# Patient Record
Sex: Female | Born: 1970 | Race: White | Hispanic: No | Marital: Married | Smoking: Current every day smoker
Health system: Southern US, Community
[De-identification: ages and names within clinical notes are randomized; demographics above are authoritative.]

## PROBLEM LIST (undated history)

## (undated) DIAGNOSIS — F319 Bipolar disorder, unspecified: Secondary | ICD-10-CM

## (undated) DIAGNOSIS — M48061 Spinal stenosis, lumbar region without neurogenic claudication: Secondary | ICD-10-CM

## (undated) DIAGNOSIS — H579 Unspecified disorder of eye and adnexa: Secondary | ICD-10-CM

## (undated) DIAGNOSIS — I82409 Acute embolism and thrombosis of unspecified deep veins of unspecified lower extremity: Secondary | ICD-10-CM

## (undated) DIAGNOSIS — G43909 Migraine, unspecified, not intractable, without status migrainosus: Secondary | ICD-10-CM

## (undated) DIAGNOSIS — G039 Meningitis, unspecified: Secondary | ICD-10-CM

## (undated) HISTORY — DX: Bipolar disorder, unspecified: F31.9

## (undated) HISTORY — DX: Unspecified disorder of eye and adnexa: H57.9

## (undated) HISTORY — DX: Spinal stenosis, lumbar region without neurogenic claudication: M48.061

## (undated) HISTORY — DX: Acute embolism and thrombosis of unspecified deep veins of unspecified lower extremity: I82.409

## (undated) HISTORY — DX: Migraine, unspecified, not intractable, without status migrainosus: G43.909

## (undated) HISTORY — DX: Meningitis, unspecified: G03.9

---

## 2000-10-08 DIAGNOSIS — I82409 Acute embolism and thrombosis of unspecified deep veins of unspecified lower extremity: Secondary | ICD-10-CM

## 2000-10-08 HISTORY — DX: Acute embolism and thrombosis of unspecified deep veins of unspecified lower extremity: I82.409

## 2004-06-09 ENCOUNTER — Encounter (INDEPENDENT_AMBULATORY_CARE_PROVIDER_SITE_OTHER): Payer: Self-pay | Admitting: Specialist

## 2004-06-09 ENCOUNTER — Ambulatory Visit (HOSPITAL_COMMUNITY): Admission: RE | Admit: 2004-06-09 | Discharge: 2004-06-09 | Payer: Self-pay | Admitting: Gynecology

## 2004-12-23 ENCOUNTER — Emergency Department: Payer: Self-pay | Admitting: Emergency Medicine

## 2006-07-11 ENCOUNTER — Ambulatory Visit: Payer: Self-pay | Admitting: Internal Medicine

## 2006-10-08 HISTORY — PX: OTHER SURGICAL HISTORY: SHX169

## 2006-10-14 ENCOUNTER — Encounter (INDEPENDENT_AMBULATORY_CARE_PROVIDER_SITE_OTHER): Payer: Self-pay | Admitting: Gynecology

## 2006-10-14 ENCOUNTER — Ambulatory Visit: Payer: Self-pay | Admitting: Gynecology

## 2006-10-23 ENCOUNTER — Ambulatory Visit: Payer: Self-pay | Admitting: Obstetrics & Gynecology

## 2007-02-27 ENCOUNTER — Ambulatory Visit: Payer: Self-pay | Admitting: Gynecology

## 2007-07-21 ENCOUNTER — Ambulatory Visit: Payer: Self-pay | Admitting: Gynecology

## 2007-10-07 ENCOUNTER — Ambulatory Visit (HOSPITAL_COMMUNITY): Admission: RE | Admit: 2007-10-07 | Discharge: 2007-10-08 | Payer: Self-pay | Admitting: Gynecology

## 2007-10-07 ENCOUNTER — Encounter (INDEPENDENT_AMBULATORY_CARE_PROVIDER_SITE_OTHER): Payer: Self-pay | Admitting: Gynecology

## 2007-10-07 ENCOUNTER — Ambulatory Visit: Payer: Self-pay | Admitting: Gynecology

## 2007-10-09 HISTORY — PX: ABDOMINAL HYSTERECTOMY: SHX81

## 2007-10-13 ENCOUNTER — Ambulatory Visit: Payer: Self-pay | Admitting: Gynecology

## 2007-10-14 ENCOUNTER — Ambulatory Visit: Payer: Self-pay | Admitting: Gynecology

## 2007-10-15 ENCOUNTER — Ambulatory Visit (HOSPITAL_COMMUNITY): Admission: RE | Admit: 2007-10-15 | Discharge: 2007-10-15 | Payer: Self-pay | Admitting: Gynecology

## 2007-10-15 ENCOUNTER — Ambulatory Visit: Payer: Self-pay | Admitting: Gynecology

## 2008-12-09 ENCOUNTER — Ambulatory Visit: Payer: Self-pay | Admitting: Internal Medicine

## 2009-07-06 ENCOUNTER — Ambulatory Visit: Payer: Self-pay | Admitting: Internal Medicine

## 2009-07-06 DIAGNOSIS — R030 Elevated blood-pressure reading, without diagnosis of hypertension: Secondary | ICD-10-CM

## 2009-07-06 DIAGNOSIS — M48061 Spinal stenosis, lumbar region without neurogenic claudication: Secondary | ICD-10-CM | POA: Insufficient documentation

## 2009-07-06 DIAGNOSIS — R002 Palpitations: Secondary | ICD-10-CM | POA: Insufficient documentation

## 2009-07-06 DIAGNOSIS — F319 Bipolar disorder, unspecified: Secondary | ICD-10-CM

## 2009-07-06 DIAGNOSIS — E785 Hyperlipidemia, unspecified: Secondary | ICD-10-CM | POA: Insufficient documentation

## 2009-07-06 DIAGNOSIS — Z86718 Personal history of other venous thrombosis and embolism: Secondary | ICD-10-CM

## 2009-08-01 ENCOUNTER — Telehealth: Payer: Self-pay | Admitting: Internal Medicine

## 2009-09-08 ENCOUNTER — Ambulatory Visit: Payer: Self-pay | Admitting: Internal Medicine

## 2009-09-09 ENCOUNTER — Telehealth: Payer: Self-pay | Admitting: Internal Medicine

## 2009-09-14 LAB — CONVERTED CEMR LAB
Albumin: 3.8 g/dL (ref 3.5–5.2)
Cholesterol: 175 mg/dL (ref 0–200)
Direct LDL: 102.7 mg/dL
Glucose, Bld: 260 mg/dL — ABNORMAL HIGH (ref 70–99)
Hgb A1c MFr Bld: 9.7 % — ABNORMAL HIGH (ref 4.6–6.5)
Phosphorus: 3.6 mg/dL (ref 2.3–4.6)
Potassium: 4.6 meq/L (ref 3.5–5.1)
Sodium: 138 meq/L (ref 135–145)
Triglycerides: 319 mg/dL — ABNORMAL HIGH (ref 0.0–149.0)

## 2009-10-05 ENCOUNTER — Telehealth: Payer: Self-pay | Admitting: Internal Medicine

## 2009-11-01 ENCOUNTER — Encounter: Payer: Self-pay | Admitting: Internal Medicine

## 2010-02-13 ENCOUNTER — Encounter: Payer: Self-pay | Admitting: Internal Medicine

## 2010-02-15 ENCOUNTER — Telehealth: Payer: Self-pay | Admitting: Internal Medicine

## 2010-02-16 ENCOUNTER — Ambulatory Visit: Payer: Self-pay | Admitting: Internal Medicine

## 2010-02-16 DIAGNOSIS — H20029 Recurrent acute iridocyclitis, unspecified eye: Secondary | ICD-10-CM

## 2010-02-17 ENCOUNTER — Encounter: Payer: Self-pay | Admitting: Internal Medicine

## 2010-03-24 ENCOUNTER — Ambulatory Visit: Payer: Self-pay | Admitting: Internal Medicine

## 2010-03-24 DIAGNOSIS — I10 Essential (primary) hypertension: Secondary | ICD-10-CM | POA: Insufficient documentation

## 2010-08-03 ENCOUNTER — Ambulatory Visit: Payer: Self-pay | Admitting: Internal Medicine

## 2010-08-03 DIAGNOSIS — F17200 Nicotine dependence, unspecified, uncomplicated: Secondary | ICD-10-CM

## 2010-08-03 DIAGNOSIS — R05 Cough: Secondary | ICD-10-CM

## 2010-08-10 ENCOUNTER — Telehealth: Payer: Self-pay | Admitting: Family Medicine

## 2010-09-21 ENCOUNTER — Ambulatory Visit: Payer: Self-pay | Admitting: Internal Medicine

## 2010-11-05 LAB — CONVERTED CEMR LAB
Albumin: 3.7 g/dL (ref 3.5–5.2)
BUN: 8 mg/dL (ref 6–23)
Basophils Relative: 0.4 % (ref 0.0–3.0)
Bilirubin, Direct: 0 mg/dL (ref 0.0–0.3)
CO2: 31 meq/L (ref 19–32)
Calcium: 9.3 mg/dL (ref 8.4–10.5)
Chloride: 103 meq/L (ref 96–112)
Cholesterol: 253 mg/dL — ABNORMAL HIGH (ref 0–200)
Creatinine, Ser: 0.7 mg/dL (ref 0.4–1.2)
Direct LDL: 179.4 mg/dL
Eosinophils Absolute: 0.1 10*3/uL (ref 0.0–0.7)
Eosinophils Relative: 1.2 % (ref 0.0–5.0)
Glucose, Bld: 162 mg/dL — ABNORMAL HIGH (ref 70–99)
HCT: 47.1 % — ABNORMAL HIGH (ref 36.0–46.0)
HDL: 31.2 mg/dL — ABNORMAL LOW (ref >39.00)
Hgb A1c MFr Bld: 9 % — ABNORMAL HIGH (ref 4.6–6.5)
Lymphocytes Relative: 22.3 % (ref 12.0–46.0)
Lymphs Abs: 2.6 10*3/uL (ref 0.7–4.0)
MCHC: 33.5 g/dL (ref 30.0–36.0)
MCV: 93.2 fL (ref 78.0–100.0)
Microalb, Ur: 1.5 mg/dL (ref 0.0–1.9)
Monocytes Absolute: 0.6 10*3/uL (ref 0.1–1.0)
Phosphorus: 3.8 mg/dL (ref 2.3–4.6)
RBC: 5.05 M/uL (ref 3.87–5.11)
RDW: 13.9 % (ref 11.5–14.6)
Sodium: 140 meq/L (ref 135–145)
TSH: 1.76 microintl units/mL (ref 0.35–5.50)
Triglycerides: 309 mg/dL — ABNORMAL HIGH (ref 0.0–149.0)
Valproic Acid Lvl: 45.2 ug/mL — ABNORMAL LOW (ref 50.0–100.0)
WBC: 11.6 10*3/uL — ABNORMAL HIGH (ref 4.5–10.5)

## 2010-11-08 NOTE — Progress Notes (Signed)
Summary: still has terrible cough  Phone Note Call from Patient Call back at Work Phone [phone]   Caller: Patient Call For: Dr. Sharen Hones Summary of Call: Patient was in on 08-03-10, she says that she is feeling alot better than she was, but has continued to have a terrible cough that is worse at night and is keeping her from getting any sleep. She is asking if you could call her in something any stronger for the cough. Please advise. [facility] [address]  Initial call taken by: Melody Comas,  August 10, 2010 3:34 PM  Follow-up for Phone Call        please call in tussionex (very good for cough but expensive).  if too expensive to have her call us for cheaper less effective alternative. Follow-up by: Eustaquio Boyden  MD,  August 10, 2010 3:45 PM  Additional Follow-up for Phone Call Additional follow up Details #1::        Rx called and message left notifying patient Additional Follow-up by: Janee Morn CMA Duncan Dull),  August 10, 2010 4:11 PM    New/Updated Medications: Sandria Senter ER 10-8 MG/5ML LQCR (HYDROCOD POLST-CHLORPHEN POLST) 1teaspoon two times a day as needed cough Prescriptions: TUSSIONEX PENNKINETIC ER 10-8 MG/5ML LQCR (HYDROCOD POLST-CHLORPHEN POLST) 1teaspoon two times a day as needed cough  #100cc x 0   Entered and Authorized by:   Eustaquio Boyden  MD   Signed by:   Janee Morn CMA (AAMA) on 08/10/2010   Method used:   Telephoned to ...       [facility] [phone]* (retail)       [address] [place], [place]  [address]       Ph: [phone] or [phone]       Fax: [phone]   RxID:   [id number]

## 2010-11-08 NOTE — Assessment & Plan Note (Signed)
Summary: PROBLEMS W/ EYE/DS   Vital Signs:  Patient profile:   40 year old female Weight:      252 pounds Temp:     98.2 degrees F oral Pulse rate:   88 / minute Pulse rhythm:   regular BP sitting:   142 / 98  (left arm) Cuff size:   large  Vitals Entered By: Mervin Hack CMA Duncan Dull) (Feb 16, 2010 2:50 PM) CC: pain in eye   History of Present Illness: Started with trouble with eye and face 5-6 days ago Noted pressure and diplopia 4 days ago went to [facility]  Diagnosed with iridocyclitis PRedForte diagnosed  sent to [facility] ophtho in [place] preferred to send to [facility] (where ophtho and rheumatolgy followed) Had been on azathioprine  in past---off for 6-7 years now  Not really better with the steroid drops didn't tolerate prednisone in past  Allergies: 1)  ! Penicillin V Potassium (Penicillin V Potassium) 2)  Metformin Hcl (Metformin Hcl)  Past History:  Past medical, surgical, family and social histories (including risk factors) reviewed for relevance to current acute and chronic problems.  Past Medical History: Reviewed history from 07/06/2009 and no changes required. Diabetes mellitus, type II DVT, hx of--2002   Right arm and PE. "Work up negative Seizure disorder Bipolar disorder Inflammatory eye disorder Migraines Lumbar spinal stenosis/disk disease Hyperlipidemia  Past Surgical History: Reviewed history from 07/06/2009 and no changes required. 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 Dad died of lung cancer @74  Mom with DM, HTN 5 siblings are healthy CAD in pat GM No other DM No breast or colon cancer  Social History: Reviewed history from 07/06/2009 and no changes required. Married--no children. Fertility problems Current Smoker--quit but restarted. 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  NAD Msk:  no joint tenderness and no joint swelling.   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 Take 3 by mouth once daily at bedtime 2)  Lipitor 20 Mg Tabs (Atorvastatin calcium) .... Take 1 by mouth once daily 3)  Actos 45 Mg Tabs (Pioglitazone hcl) .... Take 1 by mouth once daily 4)  Lantus For Opticlik 100 Unit/ml Soln (Insulin glargine) .... Patient uses 20 units 5)  Novolog Flexpen 100 Unit/ml Soln (Insulin aspart) .... Uses sliding scale before meals 6)  Maxalt 10 Mg Tabs (Rizatriptan benzoate) .Marland Kitchen.. 1 at onset of migraine headache 7)  Fluconazole 150 Mg Tabs (Fluconazole) .... Take 1 by mouth once daily  Patient Instructions: 1)  Please keep June 17th appt 2)  Referral Appointment Information 3)  Day/Date: 4)  Time: 5)  Place/MD: 6)  Address: 7)  Phone/Fax: 8)  Patient given appointment information. Information/Orders faxed/mailed.  Current Allergies (reviewed today): ! PENICILLIN V POTASSIUM (PENICILLIN V POTASSIUM) METFORMIN HCL (METFORMIN HCL)

## 2010-11-08 NOTE — Letter (Signed)
[facility]   Imported By: Lanelle Bal 11/05/2009 09:01:08  _____________________________________________________________________  External Attachment:    Type:   Image     Comment:   External Document  Appended Document: [facility]    Clinical Lists Changes  Observations: Added new observation of DIAB EYE EX: Stable backround diabetic retinopathy (11/01/2009 11:16)       Diabetic Eye Exam  Procedure date:  11/01/2009  Findings:      Stable backround diabetic retinopathy

## 2010-11-08 NOTE — Assessment & Plan Note (Signed)
Summary: 6 M F/U DLO R/S FROM 02/09/10   Vital Signs:  Patient profile:   40 year old female Weight:      257 pounds Temp:     98.7 degrees F oral Pulse rate:   80 / minute Pulse rhythm:   regular BP sitting:   160 / 100  (left arm) Cuff size:   large  Vitals Entered By: Mervin Hack CMA Duncan Dull) (March 24, 2010 2:13 PM) CC: 6 month follow-up   History of Present Illness: eye is better now on azathioprine  no diplopia or swelling  Sugars have been running high 215 this AM.  Fasing generally 150 or above started back to school --routine and eating are off hasn't check postprandial sugars at all No hypoglycemic reactions  intermittent headaches---seems to be stress related No chest pain  No SOB   Allergies: 1)  ! Penicillin V Potassium (Penicillin V Potassium) 2)  Metformin Hcl (Metformin Hcl)  Past History:  Past medical, surgical, family and social histories (including risk factors) reviewed, and no changes noted (except as noted below).  Past Medical History: Diabetes mellitus, type II DVT, hx of--2002   Right arm and PE. "Work up negative Seizure disorder Bipolar disorder Inflammatory eye disorder Migraines Lumbar spinal stenosis/disk disease Hyperlipidemia Hypertension  Past Surgical History: Reviewed history from 07/06/2009 and no changes required. Meningitis with seizure--age 89 Hysterectomy--2008  Dr Mia Creek  Family History: Reviewed history from 07/06/2009 and no changes required. Dad died of lung cancer @74  Mom with DM, HTN 5 siblings are healthy CAD in pat GM No other DM No breast or colon cancer  Social History: Reviewed history from 07/06/2009 and no changes required. Married--no children. Fertility problems Current Smoker--quit but restarted. 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   Neck:  supple, no masses, no thyromegaly, no carotid bruits, and no cervical lymphadenopathy.   Lungs:  normal respiratory effort and normal breath sounds.   Heart:  normal rate, regular rhythm, no murmur, and no gallop.   Pulses:  1+ in feet Extremities:  no edema Skin:  no suspicious lesions and no ulcerations.   Psych:  normally interactive, good eye contact, not anxious appearing, and not depressed appearing.    Diabetes Management Exam:    Foot Exam (with socks and/or shoes not present):       Sensory-Pinprick/Light touch:          Left medial foot (L-4): normal          Left dorsal foot (L-5): normal          Left lateral foot (S-1): normal          Right medial foot (L-4): normal          Right dorsal foot (L-5): normal          Right lateral foot (S-1): normal       Inspection:          Left foot: normal          Right foot: normal       Nails:          Left foot: normal          Right foot: normal   Impression &  Recommendations:  Problem # 1:  DIABETES MELLITUS, TYPE II (ICD-250.00) Assessment Deteriorated  hasn't increased insulin enough poor lifestyle habits discussed increased insulin must crack down on eating, etc  Her updated medication list for this problem includes:    Actos 45 Mg Tabs (Pioglitazone hcl) .Marland Kitchen... Take 1 by mouth once daily    Lantus For Opticlik 100 Unit/ml Soln (Insulin glargine) .Marland KitchenMarland KitchenMarland KitchenMarland Kitchen 35  units injected nightly    Novolog Flexpen 100 Unit/ml Soln (Insulin aspart) .Marland KitchenMarland KitchenMarland KitchenMarland Kitchen 5 units before every meal. increase as needed per the sliding scale  Orders: Venipuncture (29562) Specimen Handling (13086) T- Hemoglobin A1C ([address]-96295)  Problem # 2:  HYPERTENSION (ICD-401.9) Assessment: New persistent elevation should consider meds she asks for 1 more visit to work on lifestyle  BP today: 160/100 Prior BP: 142/98 (02/16/2010)  Labs  Reviewed: K+: 4.6 (09/08/2009) Creat: : 0.6 (09/08/2009)   Chol: 175 (09/08/2009)   HDL: 31.30 (09/08/2009)   TG: 319.0 (09/08/2009)  Problem # 3:  HYPERLIPIDEMIA (ICD-272.4) Assessment: Unchanged okay on meds  Her updated medication list for this problem includes:    Lipitor 20 Mg Tabs (Atorvastatin calcium) .Marland Kitchen... 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 Take 3 by mouth once daily at bedtime 2)  Lipitor 20 Mg Tabs (Atorvastatin calcium) .... Take 1 by mouth once daily 3)  Actos 45 Mg Tabs (Pioglitazone hcl) .... Take 1 by mouth once daily 4)  Lantus For Opticlik 100 Unit/ml Soln (Insulin glargine) .... 35  units injected nightly 5)  Novolog Flexpen 100 Unit/ml Soln (Insulin aspart) .... 5 units before every meal. increase as needed per the sliding scale 6)  Maxalt 10 Mg Tabs (Rizatriptan benzoate) .Marland Kitchen.. 1 at onset of migraine headache 7)  Azathioprine 50 Mg Tabs (Azathioprine) .Marland Kitchen.. 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 If your fasting sugars are still above 150 in 1 week, please increase the lantus to 40 units and increase by 5 units every week till fasting sugars consistently under 140. 2)  Please schedule a follow-up appointment in 6 months for physical Prescriptions: NOVOLOG FLEXPEN 100 UNIT/ML SOLN (INSULIN ASPART) 5 units before every meal. Increase as needed per the sliding scale  #2000 units x 3   Entered and  Authorized by:   Cindee Salt MD   Signed by:   Cindee Salt MD on 03/24/2010   Method used:   Print then Give to Patient   RxID:   [id number] LANTUS FOR OPTICLIK 100 UNIT/ML SOLN (INSULIN GLARGINE) 35  units injected nightly  #4000 units x 3   Entered and Authorized by:   Cindee Salt MD   Signed by:   Cindee Salt MD on 03/24/2010   Method used:   Print then Give to Patient   RxID:   [id number] ACTOS 45 MG TABS (PIOGLITAZONE HCL) take 1 by mouth once daily  #90 x 3   Entered and Authorized by:   Cindee Salt MD   Signed by:   Gerlene Burdock  Dia Crawford MD on 03/24/2010   Method used:   Print then Give to Patient   RxID:   [id number] LIPITOR 20 MG TABS (ATORVASTATIN CALCIUM) take 1 by mouth once daily  #90 x 3   Entered and Authorized by:   Cindee Salt MD   Signed by:   Cindee Salt MD on 03/24/2010   Method used:   Print then Give to Patient   RxID:   [id number] DEPAKOTE ER 500 MG XR24H-TAB (DIVALPROEX SODIUM) take 3 by mouth once daily at bedtime Brand medically necessary #270 x 3   Entered and Authorized by:   Cindee Salt MD   Signed by:   Cindee Salt MD on 03/24/2010   Method used:   Print then Give to Patient   RxID:   [id number]   Current Allergies (reviewed today): ! PENICILLIN V POTASSIUM (PENICILLIN V POTASSIUM) METFORMIN HCL (METFORMIN HCL)

## 2010-11-08 NOTE — Assessment & Plan Note (Signed)
Summary: COUGH,CONGESTION,HA,DRAINAGE/CLE   Vital Signs:  Patient profile:   40 year old female Height:      68 inches Weight:      256.75 pounds BMI:     39.18 O2 Sat:      92 % on Room air Temp:     98.2 degrees F oral Pulse rate:   98 / minute Pulse rhythm:   regular BP sitting:   112 / 78  (left arm) Cuff size:   large  Vitals Entered By: Selena Batten Dance CMA Duncan Dull) (August 03, 2010 11:16 AM)  O2 Flow:  Room air CC: Cough/Congestion   History of Present Illness: CC: congestion  4d h/o chest congestion, very tight, sinus pressure headache, cough productive of phlegm.  + clear nasal discharge/mucous.  + post tussive emesis.  Tried nyquil and liquid mucinex which mildly helped.  + PNdrip.  + fever at night (subjective).  + wheezing  No abd pain, nausea.  No skin rash, myalgias or arthralgias.  No sick contacts at home.  + pt smokes,  ~18 PY hx, 1ppd, has stopped last few days.  No h/o asthma/COPD.  No seasonal allergies.  h/o DM on insulin.  doesn't do well on steroids given bipolar disorder.  Current Medications (verified): 1)  Depakote Er 500 Mg Xr24h-Tab (Divalproex Sodium) .... Take 3 By Mouth Once Daily At Bedtime 2)  Lipitor 20 Mg Tabs (Atorvastatin Calcium) .... Take 1 By Mouth Once Daily 3)  Actos 45 Mg Tabs (Pioglitazone Hcl) .... Take 1 By Mouth Once Daily 4)  Lantus For Opticlik 100 Unit/ml Soln (Insulin Glargine) .... 35  Units Injected Nightly 5)  Novolog Flexpen 100 Unit/ml Soln (Insulin Aspart) .... 5 Units Before Every Meal. Increase As Needed Per The Sliding Scale 6)  Maxalt 10 Mg Tabs (Rizatriptan Benzoate) .Marland Kitchen.. 1 At Onset of Migraine Headache  Allergies: 1)  ! Penicillin V Potassium (Penicillin V Potassium) 2)  Metformin Hcl (Metformin Hcl)  Past History:  Past Medical History: Last updated: 03/24/2010 Diabetes mellitus, type II DVT, hx of--2002   Right arm and PE. "Work up negative Seizure disorder Bipolar disorder Inflammatory eye  disorder Migraines Lumbar spinal stenosis/disk disease Hyperlipidemia Hypertension  Social History: Last updated: 07/06/2009 Married--no children. Fertility problems Current Smoker--quit but restarted. 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  nontoxic.  tired, coughing, congested, wheezing Head:  Normocephalic and atraumatic without obvious abnormalities. No apparent alopecia or balding.  mild maxillary sinus tenderness bilaterally Eyes:  No corneal or conjunctival inflammation noted. EOMI. Perrla. Ears:  External ear exam shows no significant lesions or deformities.  Otoscopic examination reveals clear canals, tympanic membranes are intact bilaterally without bulging, retraction, inflammation or discharge. Hearing is grossly normal bilaterally. Nose:  External nasal examination shows no deformity or inflammation. Nasal mucosa are pink and moist without lesions or exudates. Mouth:  Oral mucosa and oropharynx without lesions or exudates.  Teeth in good repair. Neck:  supple, no masses, no thyromegaly, no carotid bruits, and no cervical lymphadenopathy.   Lungs:  tight, poor air movement.  + insp/exp rhonchi/wheeze.  after treatment, improved air movement, still some exp wheezing but not rhonchi.  no rales.  normal wob. Heart:  normal rate, regular rhythm, no murmur, and no gallop.   Pulses:  2+ rad  pulses Extremities:  no edema. good cap refill, skin turgor.   Impression & Recommendations:  Problem # 1:  COUGH (ICD-786.2) in smoker.  acute bronchitis with RAD component.  ? early COPD given wheeze on exam.  Given cannot tolerate oral steroids 2/2 bipolar, treat with ICS, high dose (sample provided).  Sent in azithromycin as well as cough syrup for night time use.  red flags to return  discussed.  Orders: T-2 View CXR (71020TC) Albuterol Sulfate Sol 1mg  unit dose (Z6109) Atrovent 1mg  (Neb) (U0454) Nebulizer Tx (09811)  Problem # 2:  TOBACCO USER (ICD-305.1) encouraged cessation.  Complete Medication List: 1)  Depakote Er 500 Mg Xr24h-tab (Divalproex sodium) .... Take 3 by mouth once daily at bedtime 2)  Lipitor 20 Mg Tabs (Atorvastatin calcium) .... Take 1 by mouth once daily 3)  Actos 45 Mg Tabs (Pioglitazone hcl) .... Take 1 by mouth once daily 4)  Lantus For Opticlik 100 Unit/ml Soln (Insulin glargine) .... 35  units injected nightly 5)  Novolog Flexpen 100 Unit/ml Soln (Insulin aspart) .... 5 units before every meal. increase as needed per the sliding scale 6)  Maxalt 10 Mg Tabs (Rizatriptan benzoate) .Marland Kitchen.. 1 at onset of migraine headache 7)  Ventolin Hfa 108 (90 Base) Mcg/act Aers (Albuterol sulfate) .... Two puffs q4-6 hours x 2 days then as needed wheezing/sob. 8)  Zithromax Z-pak 250 Mg Tabs (Azithromycin) .... Take as directed 9)  Cheratussin Ac 100-10 Mg/14ml Syrp (Guaifenesin-codeine) .... One teaspoon q6 hours prn cough (mainly for night use)  Patient Instructions: 1)  This sounds like a bad bronchitis, could be early COPD from smoking. 2)  Use medication as prescribed:  albuterol inhaler every 4-6 hours for next 2-3 days then as needed for cough/wheeze.  Inhaled steroid (flovent 250) for next few days - one inhalation twice daily.  cough syrup for night time use. 3)  Please return if you are not improving as expected, or if you have high fevers (>101.5) or worsening breathing or concerns. 4)  Call clinic with questions.  Pleasure to see you today.  hope you feel better. Prescriptions: CHERATUSSIN AC 100-10 MG/5ML SYRP (GUAIFENESIN-CODEINE) one teaspoon q6 hours prn cough (mainly for night use)  #100cc x 0   Entered and Authorized by:   Eustaquio Boyden  MD   Signed by:   Eustaquio Boyden  MD on 08/03/2010   Method used:   Print then Give to Patient    RxID:   [id number] Christena Deem Z-PAK 250 MG TABS (AZITHROMYCIN) take as directed  #1 x 0   Entered and Authorized by:   Eustaquio Boyden  MD   Signed by:   Eustaquio Boyden  MD on 08/03/2010   Method used:   [facility]        [facility] [phone]* (retail)       [address] [place], [place]  [address]       Ph: [phone] or [phone]       Fax: [phone]   RxID:   [id number] VENTOLIN HFA 108 (90 BASE) MCG/ACT AERS (ALBUTEROL SULFATE) two puffs q4-6 hours x 2 days then as needed wheezing/sob.  #1 x 3   Entered and Authorized by:   Eustaquio Boyden  MD   Signed by:   Eustaquio Boyden  MD on 08/03/2010   Method used:   [facility]        [facility] [phone]* (retail)       [address] [place], [place]  [address]       Ph: [phone] or [phone]       Fax: [phone]   RxID:   [id number]    Medication Administration  Medication # 1:    Medication: Albuterol Sulfate Sol 1mg  unit dose    Diagnosis: COUGH (ICD-786.2)    Dose: 2.5    Route: inhaled    Exp Date: 12/05/2010    Lot #: [id number]    Mfr: Nephron    Comments: Per Dr. Sharen Hones    Patient tolerated medication without complications    Given by: Selena Batten Dance CMA Duncan Dull) (August 03, 2010 11:43 AM)  Medication # 2:    Medication: Atrovent 1mg  (Neb)    Diagnosis: COUGH (ICD-786.2)    Dose: 0.5    Route: inhaled    Exp Date: 12/05/2010    Lot #: [id number]    Mfr: Nephron    Comments: Per Dr. Sharen Hones    Patient tolerated medication without complications    Given by: Selena Batten Dance CMA Duncan Dull) (August 03, 2010 11:43 AM)  Orders Added: 1)  T-2 View CXR [71020TC] 2)  Albuterol Sulfate Sol 1mg  unit dose [J7613] 3)  Atrovent 1mg  (Neb) [J1884] 4)  Nebulizer Tx [94640] 5)  Est. Patient Level IV [16606]    Current Allergies (reviewed today): ! PENICILLIN V POTASSIUM (PENICILLIN V POTASSIUM) METFORMIN HCL (METFORMIN HCL)   Appended  Document: COUGH,CONGESTION,HA,DRAINAGE/CLE obtained CXR given lung findings and h/o fever.

## 2010-11-08 NOTE — Letter (Signed)
Summary: [facility]   Imported By: Lanelle Bal 02/22/2010 11:20:38  _____________________________________________________________________  External Attachment:    Type:   Image     Comment:   External Document

## 2010-11-08 NOTE — Consult Note (Signed)
Summary: [facility]   Imported By: Lanelle Bal 03/13/2010 08:29:48  _____________________________________________________________________  External Attachment:    Type:   Image     Comment:   External Document  Appended Document: [facility] recurrence of orbital pseudotumor restarting azathioprine

## 2010-11-08 NOTE — Progress Notes (Signed)
Summary: Pain in eye  Phone Note Call from Patient Call back at Home Phone [phone] Call back at Work Phone [phone]   Caller: Patient Call For: Cindee Salt MD Summary of Call: pt calling wanting appt for her eye pain, pt states she has inflamation of the eye( she said some type of eye disease) and was seen previously at [facility] for this. Pt states she was taking Imuran?she has been off this for 5 years or more. Pt states she went to eye dr.(didn't mention name) in [place] and he sent her to eye doctor in [place]( no name) and that dr wanted her to return to [facility]. Pt is confused and doesn't know what to do, pt has appt scheduled here tomorrow with you. Initial call taken by: Mervin Hack CMA Duncan Dull),  Feb 15, 2010 5:17 PM  Follow-up for Phone Call        I can discuss the plan See if you can get any notes from any of the eye doctors Follow-up by: Cindee Salt MD,  Feb 15, 2010 5:57 PM  Additional Follow-up for Phone Call Additional follow up Details #1::        spoke with [facility] Danielle, she will get office notes and fax over. Additional Follow-up by: Mervin Hack CMA Duncan Dull),  Feb 16, 2010 11:26 AM

## 2010-12-01 ENCOUNTER — Encounter: Payer: Self-pay | Admitting: Internal Medicine

## 2010-12-01 LAB — HM DIABETES EYE EXAM

## 2010-12-06 ENCOUNTER — Ambulatory Visit (INDEPENDENT_AMBULATORY_CARE_PROVIDER_SITE_OTHER): Payer: PRIVATE HEALTH INSURANCE | Admitting: Family Medicine

## 2010-12-06 ENCOUNTER — Encounter: Payer: Self-pay | Admitting: Family Medicine

## 2010-12-06 DIAGNOSIS — Z111 Encounter for screening for respiratory tuberculosis: Secondary | ICD-10-CM

## 2010-12-06 DIAGNOSIS — F17200 Nicotine dependence, unspecified, uncomplicated: Secondary | ICD-10-CM

## 2010-12-06 DIAGNOSIS — Z23 Encounter for immunization: Secondary | ICD-10-CM

## 2010-12-08 ENCOUNTER — Encounter: Payer: Self-pay | Admitting: Family Medicine

## 2010-12-08 ENCOUNTER — Ambulatory Visit: Payer: PRIVATE HEALTH INSURANCE

## 2010-12-14 NOTE — Assessment & Plan Note (Signed)
Summary: to read ppd/rbh  Nurse Visit   Allergies: 1)  ! Penicillin V Potassium (Penicillin V Potassium) 2)  Metformin Hcl (Metformin Hcl)  PPD Results    Date of reading: 12/08/2010    Results: < 5mm    Interpretation: negative

## 2010-12-14 NOTE — Letter (Signed)
[facility]   Imported By: Kassie Mends 12/06/2010 09:14:18  _____________________________________________________________________  External Attachment:    Type:   Image     Comment:   External Document  Appended Document: [facility]     Clinical Lists Changes  Observations: Added new observation of DIAB EYE EX: Mild non-proliferative diabetic retinopathy.    (12/01/2010 22:05)       Diabetic Eye Exam  Procedure date:  12/01/2010  Findings:      Mild non-proliferative diabetic retinopathy.

## 2010-12-14 NOTE — Assessment & Plan Note (Signed)
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    Needs PPD, and Hep B series.  doesn't think has ever had Hep A or B.  previous shots in [place].  Thinks Dr. Alphonsus Sias has given tetanus shot - in chart done 2010 (Tdap).  smoking - < 1ppd.  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   Current Medications (verified): 1)  Depakote Er 500 Mg Xr24h-Tab (Divalproex Sodium) .... Take 3 By Mouth Once Daily At Bedtime 2)  Lipitor 20 Mg Tabs (Atorvastatin Calcium) .... Take 1 By Mouth Once Daily 3)  Actos 45 Mg Tabs (Pioglitazone Hcl) .... Take 1 By Mouth Once Daily 4)  Lantus For Opticlik 100 Unit/ml Soln (Insulin Glargine) .... 35  Units Injected Nightly 5)  Novolog Flexpen 100 Unit/ml Soln (Insulin Aspart) .... 5 Units Before Every Meal. Increase As Needed Per The Sliding Scale 6)  Maxalt 10 Mg Tabs (Rizatriptan Benzoate) .Marland Kitchen.. 1 At Onset of Migraine Headache  Allergies: 1)  ! Penicillin V Potassium (Penicillin V Potassium) 2)  Metformin Hcl (Metformin Hcl)  Past History:  Past Medical History: Last updated: 03/24/2010 Diabetes mellitus, type II DVT, hx of--2002   Right arm and PE. 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 Fertility problems Current Smoker--quit but restarted. 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  PPD placed today return friday for reading.  consider 3rd dose only Hep B vs twinrix or just give twinrix all 3 doses.  Problem # 2:  TOBACCO USER (ICD-305.1) encouraged cessation.  Complete Medication List: 1)  Depakote Er 500 Mg Xr24h-tab (Divalproex sodium) .... Take 3 by mouth once daily at bedtime 2)  Lipitor 20 Mg Tabs (Atorvastatin calcium) .... Take 1 by mouth once daily 3)  Actos 45 Mg Tabs (Pioglitazone hcl) .... Take 1 by mouth once daily 4)  Lantus For Opticlik 100 Unit/ml Soln (Insulin glargine) .... 35  units injected nightly 5)  Novolog Flexpen 100 Unit/ml Soln (Insulin aspart) .... 5 units before every meal. increase as needed per the sliding scale 6)  Maxalt 10 Mg Tabs (Rizatriptan benzoate) .Marland Kitchen.. 1 at onset of migraine headache  Other Orders: TwinRix 1ml ( Hep A&B Adult dose) (16109)  Admin 1st Vaccine [phone]) TB Skin Test [phone]) Admin of Any Addtl Vaccine (58099)  Patient Instructions: 1)  PPD placed today, return Friday for reading. 2)  Hep B shot today, return to complete series. 3)  Call us with questions.   Orders Added: 1)  TwinRix 1ml ( Hep A&B Adult dose) [90636] 2)  Admin 1st Vaccine [90471] 3)  TB Skin Test [86580] 4)  Admin of Any Addtl Vaccine [90472] 5)  Est. Patient Level II [83382]   Immunizations Administered:  TwinRix # 1:    Vaccine Type:  TwinRix    Site: right deltoid    Mfr: GlaxoSmithKline    Dose: 0.5 ml    Route: IM    Given by: Selena Batten Dance CMA (AAMA)    Exp. Date: 08/10/2012    Lot #: [id number]    VIS given: 06/26/07 version given December 06, 2010.  PPD Skin Test:    Vaccine Type: PPD    Site: left forearm    Mfr: Sanofi Pasteur    Dose: 0.1 ml    Route: ID    Given by: Selena Batten Dance CMA (AAMA)    Exp. Date: 06/08/2012    Lot #: [id number]   Immunizations Administered:  TwinRix # 1:    Vaccine Type: TwinRix    Site: right deltoid    Mfr: GlaxoSmithKline    Dose: 0.5 ml    Route: IM    Given by: Selena Batten Dance CMA (AAMA)    Exp. Date: 08/10/2012    Lot #: [id number]    VIS given: 06/26/07 version given December 06, 2010.  PPD Skin Test:    Vaccine Type: PPD    Site: left forearm    Mfr: Sanofi Pasteur    Dose: 0.1 ml    Route: ID    Given by: Selena Batten Dance CMA (AAMA)    Exp. Date: 06/08/2012    Lot #: [id number]  Current Allergies (reviewed today): ! PENICILLIN V POTASSIUM (PENICILLIN V POTASSIUM) METFORMIN HCL (METFORMIN HCL)    Prevention & Chronic Care Immunizations   Influenza vaccine: Fluvax 3+  (07/06/2009)    Tetanus booster: 07/06/2009: Tdap   Tetanus booster due: 07/07/2019    Pneumococcal vaccine: Not documented  Other Screening   Pap smear: Not documented    Mammogram: Not documented   Smoking status: current  (07/06/2009)  Diabetes Mellitus   HgbA1C: 10.2  (03/24/2010)    Eye exam: Stable backround diabetic retinopathy  (11/01/2009)   Eye exam due: 03/2010    Foot exam: yes  (03/24/2010)   High risk foot: Not documented   Foot care education: Not documented    Urine microalbumin/creatinine ratio: 7.3  (07/06/2009)  Lipids   Total Cholesterol: 175  (09/08/2009)   LDL: Not documented   LDL Direct: 102.7  (09/08/2009)   HDL: 31.30  (09/08/2009)   Triglycerides: 319.0  (09/08/2009)    SGOT (AST): 16  (07/06/2009)   SGPT (ALT): 20  (07/06/2009)   Alkaline phosphatase: 72   (07/06/2009)   Total bilirubin: 0.6  (07/06/2009)  Hypertension   Last Blood Pressure: 128 / 82  (12/06/2010)   Serum creatinine: 0.6  (09/08/2009)   Serum potassium 4.6  (09/08/2009)  Self-Management Support :    Diabetes self-management support: Not documented    Hypertension self-management support: Not documented    Lipid self-management support: Not documented

## 2010-12-23 ENCOUNTER — Encounter: Payer: Self-pay | Admitting: Internal Medicine

## 2011-01-05 ENCOUNTER — Ambulatory Visit: Payer: PRIVATE HEALTH INSURANCE

## 2011-01-11 ENCOUNTER — Ambulatory Visit (INDEPENDENT_AMBULATORY_CARE_PROVIDER_SITE_OTHER): Payer: PRIVATE HEALTH INSURANCE | Admitting: Internal Medicine

## 2011-01-11 DIAGNOSIS — Z23 Encounter for immunization: Secondary | ICD-10-CM

## 2011-01-11 NOTE — Progress Notes (Signed)
  Subjective:    Patient ID: [name], female    DOB: 1970-12-01, 40 y.o.   MRN: [id number]  HPI    Review of Systems     Objective:   Physical Exam        Assessment & Plan:

## 2011-02-09 ENCOUNTER — Encounter: Payer: Self-pay | Admitting: Internal Medicine

## 2011-02-09 ENCOUNTER — Ambulatory Visit (INDEPENDENT_AMBULATORY_CARE_PROVIDER_SITE_OTHER): Payer: PRIVATE HEALTH INSURANCE | Admitting: Internal Medicine

## 2011-02-09 VITALS — BP 108/79 | HR 67 | Temp 98.7°F | Ht 68.0 in | Wt 258.4 lb

## 2011-02-09 DIAGNOSIS — N63 Unspecified lump in unspecified breast: Secondary | ICD-10-CM

## 2011-02-09 DIAGNOSIS — Z13220 Encounter for screening for lipoid disorders: Secondary | ICD-10-CM

## 2011-02-09 DIAGNOSIS — F319 Bipolar disorder, unspecified: Secondary | ICD-10-CM

## 2011-02-09 DIAGNOSIS — R569 Unspecified convulsions: Secondary | ICD-10-CM

## 2011-02-09 DIAGNOSIS — I10 Essential (primary) hypertension: Secondary | ICD-10-CM

## 2011-02-09 DIAGNOSIS — N6320 Unspecified lump in the left breast, unspecified quadrant: Secondary | ICD-10-CM

## 2011-02-09 DIAGNOSIS — E785 Hyperlipidemia, unspecified: Secondary | ICD-10-CM

## 2011-02-09 DIAGNOSIS — E119 Type 2 diabetes mellitus without complications: Secondary | ICD-10-CM

## 2011-02-09 DIAGNOSIS — Z0000 Encounter for general adult medical examination without abnormal findings: Secondary | ICD-10-CM

## 2011-02-09 LAB — LIPID PANEL
HDL: 29.1 mg/dL — ABNORMAL LOW (ref >39.00)
Total CHOL/HDL Ratio: 5
Triglycerides: 216 mg/dL — ABNORMAL HIGH (ref 0.0–149.0)

## 2011-02-09 LAB — CBC WITH DIFFERENTIAL/PLATELET
Eosinophils Absolute: 0.2 10*3/uL (ref 0.0–0.7)
Eosinophils Relative: 2.1 % (ref 0.0–5.0)
HCT: 44.3 % (ref 36.0–46.0)
Lymphs Abs: 3.2 10*3/uL (ref 0.7–4.0)
MCHC: 34.2 g/dL (ref 30.0–36.0)
MCV: 93.6 fl (ref 78.0–100.0)
Monocytes Absolute: 0.5 10*3/uL (ref 0.1–1.0)
Neutrophils Relative %: 62.1 % (ref 43.0–77.0)
Platelets: 235 10*3/uL (ref 150.0–400.0)
WBC: 10.4 10*3/uL (ref 4.5–10.5)

## 2011-02-09 LAB — LDL CHOLESTEROL, DIRECT: Direct LDL: 73 mg/dL

## 2011-02-09 LAB — HEPATIC FUNCTION PANEL
ALT: 18 U/L (ref 0–35)
AST: 14 U/L (ref 0–37)

## 2011-02-09 LAB — BASIC METABOLIC PANEL
BUN: 11 mg/dL (ref 6–23)
Calcium: 8.8 mg/dL (ref 8.4–10.5)
Creatinine, Ser: 0.7 mg/dL (ref 0.4–1.2)
GFR: 96.35 mL/min (ref >60.00)
Glucose, Bld: 197 mg/dL — ABNORMAL HIGH (ref 70–99)

## 2011-02-09 LAB — MICROALBUMIN / CREATININE URINE RATIO: Microalb, Ur: 0.7 mg/dL (ref 0.0–1.9)

## 2011-02-09 NOTE — Patient Instructions (Signed)
Please set up your mammogram. 

## 2011-02-09 NOTE — Progress Notes (Signed)
Subjective:    Patient ID: [name], female    DOB: Feb 14, 1971, 40 y.o.   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  , Disp: , Rfl: ;  divalproex (DEPAKOTE) 500 MG 24 hr tablet, Take 500 mg by mouth. Take 3 by mouth once daily at bedtime , Disp: , Rfl: ;  insulin aspart (NOVOLOG FLEXPEN) 100 UNIT/ML injection, Inject into the skin. 5 units before every meal. Increase as needed per the sliding scale , Disp: , Rfl:  insulin glargine (LANTUS OPTICLIK) 100 UNIT/ML injection, Inject into the skin. 35 units injected nightly , Disp: , Rfl: ;  pioglitazone (ACTOS) 45 MG tablet, Take 45 mg by mouth daily.  , Disp: , Rfl: ;  rizatriptan (MAXALT) 10 MG tablet, Take 10 mg by mouth. 1 at onset of migraine headache , Disp: , Rfl:   Past Medical History  Diagnosis Date  . Diabetes mellitus     Type II  . DVT (deep venous thrombosis) 2002    right arm and PE Work up negative   . Seizures   . Bipolar disorder   . Eye disorder     inflammatory eye disorder   . Migraines   . Lumbar stenosis     disk disease   . Meningitis 7    with seizure    Past Surgical History  Procedure Date  . Other surgical history 2008    hysterectomy- Dr. Mia Creek     Family History  Problem Relation Age of Onset  . Diabetes Mother   . Hypertension Mother   . Cancer Father     lung   . Coronary artery disease Paternal Grandmother     History   Social History  . Marital Status: Married    Spouse Name: N/A    Number of Children: 0  . Years of Education: N/A   Occupational History  . 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 Smoking status: Current Everyday Smoker  . Smokeless tobacco: Not on file   Comment: quit but restarted. Plans to stop again with husband and commt lozenges   . Alcohol Use: Yes     rare- wine   . Drug Use: Not on file  . Sexually Active: Not on file   Other Topics Concern  . Not on file   Social History Narrative  . No narrative on file   Review of Systems  Constitutional: Negative for fatigue and unexpected weight change.       Wears seat belt  HENT: Positive for rhinorrhea. Negative for hearing loss, dental problem, postnasal drip and tinnitus.        Mild seasonal allergies No meds  Eyes: Negative for visual disturbance.       No vision issues Recent eye exam fine  Respiratory: Negative for cough, chest tightness and shortness of breath.   Cardiovascular: Positive for leg swelling. Negative for chest pain and palpitations.       Mild edema---transient  Gastrointestinal: Negative for nausea, vomiting, abdominal pain,  constipation and blood in stool.       No heartburn  Genitourinary: Negative for dysuria, urgency, vaginal bleeding, difficulty urinating and dyspareunia.  Musculoskeletal: Positive for back pain. Negative for joint swelling and arthralgias.  Skin: Negative for rash.       No suspicious lesions  Neurological: Positive for headaches. Negative for dizziness, seizures, syncope, weakness and light-headedness.       Occ migraine  Hematological: Negative for adenopathy. Does not bruise/bleed easily.  Psychiatric/Behavioral: Positive for sleep disturbance. Negative for dysphoric mood. The patient is not nervous/anxious.        Occ sleep problems with stress Mood fairly stable though       Objective:   Physical Exam  Constitutional: She is oriented to person, place, and time. She appears well-developed and well-nourished. No distress.  HENT:  Head: Normocephalic and atraumatic.  Right Ear: External ear normal.  Left  Ear: External ear normal.  Mouth/Throat: Oropharynx is clear and moist. No oropharyngeal exudate.       TMs normal  Eyes: Conjunctivae and EOM are normal. Pupils are equal, round, and reactive to light.       Fundi benign  Neck: Normal range of motion. Neck supple. No thyromegaly present.  Cardiovascular: Normal rate, regular rhythm, normal heart sounds and intact distal pulses.  Exam reveals no gallop.   No murmur heard. Pulmonary/Chest: Effort normal and breath sounds normal. No respiratory distress. She has no wheezes. She has no rales.  Abdominal: Soft. She exhibits no mass. There is no tenderness.  Genitourinary:       Breast with sig bilateral cystic changes Very localized mass on left  ~ 8 o'clock---seems to be freely movable  Musculoskeletal: Normal range of motion. She exhibits no edema and no tenderness.  Lymphadenopathy:    She has no cervical adenopathy.    She has no axillary adenopathy.  Neurological: She is alert and oriented to person, place, and time. She exhibits normal muscle tone.       Normal sensation in feet No weakness  Skin: Skin is warm. No rash noted.  Psychiatric: She has a normal mood and affect. Her behavior is normal. Judgment and thought content normal.          Assessment & Plan:

## 2011-02-10 LAB — VALPROIC ACID LEVEL: Valproic Acid Lvl: 50.9 ug/mL (ref 50.0–100.0)

## 2011-02-19 MED ORDER — GLIPIZIDE ER 10 MG PO TB24: 10.0000 mg | ORAL_TABLET | Freq: Every day | ORAL | Status: DC

## 2011-02-19 NOTE — Progress Notes (Signed)
Addended by: Mervin Hack on: 02/19/2011 01:19 PM   Modules accepted: Orders

## 2011-02-20 ENCOUNTER — Ambulatory Visit: Payer: Self-pay | Admitting: Internal Medicine

## 2011-02-20 NOTE — Op Note (Signed)
[name], [name]              ACCOUNT NO.:  [id number]   MEDICAL RECORD NO.:  [id number]         PATIENT TYPE:  WOIB   LOCATION:                                 FACILITY:   PHYSICIAN:  Ginger Carne, MD  DATE OF BIRTH:  1971-07-13   DATE OF PROCEDURE:  10/07/2007  DATE OF DISCHARGE:                               OPERATIVE REPORT   PREOP DIAGNOSIS:  Menometrorrhagia.   POSTOP DIAGNOSIS:  Menometrorrhagia.   PROCEDURE:  Total vaginal hysterectomy with preservation of both tubes  and ovaries.   SURGEON:  Mia Creek, MD  ASSISTANTSilas Flood, MD.   ESTIMATED BLOOD LOSS:  Less than 75 mL.   COMPLICATIONS:  None immediate.   ANESTHESIA:  General.   SPECIMEN:  Uterus and cervix.   OPERATIVE FINDINGS:  External genitalia, vulva and vagina normal.  Cervix smooth without erosions or lesions.  The uterus was normal in  size.  Both ovaries and tubes appeared normal.   OPERATIVE PROCEDURE:  The patient was prepped and draped in usual  fashion and placed in lithotomy position.  Betadine solution used for  antiseptic.  The patient was catheterized prior to procedure.  After  adequate general anesthesia, tenaculum placed on the anterior and  posterior lips of the cervix.  Marcaine with epinephrine was used as a  paracervical block, 2 cm of anterior posterior vaginal epithelium were  incised transversely.  Peritoneal reflections identified and opened  without injury to their respective organs.  Uterosacral cardinal  ligament complexes clamped and ligated with zero Vicryl suture.  Uterine  vasculature was clamped, cut and ligated in a standard Richardson  fashion with zero Vicryl suture.  This extended to the broad ligaments.  The utero-ovarian ligaments were clamped, cut and ligated with zero  Vicryl suture, and sutures were placed twice.  No active bleeding noted.  Closure of the cuff in one layer with zero Vicryl running interlocking  suture.  The patient tolerated the procedure  well, returned to the Post  Anesthesia Recovery Room in excellent condition.      Ginger Carne, MD  Electronically Signed     SHB/MEDQ  D:  10/07/2007  T:  10/07/2007  Job:  [id number]

## 2011-02-20 NOTE — Discharge Summary (Signed)
[name], [name]              ACCOUNT NO.:  [id number]   MEDICAL RECORD NO.:  [id number]          PATIENT TYPE:  OIB   LOCATION:  9303                          FACILITY:  [facility]   PHYSICIAN:  Ginger Carne, MD  DATE OF BIRTH:  [birth year]   DATE OF ADMISSION:  10/07/2007  DATE OF DISCHARGE:                               DISCHARGE SUMMARY   REASON FOR HOSPITALIZATION:  Menometrorrhagia.   IN HOSPITAL PROCEDURES:  Total vaginal hysterectomy with preservation of  both tubes and ovaries.   FINAL DIAGNOSES:  Menometrorrhagia.   HOSPITAL COURSE:  This is a 40 year old Caucasian female with known who  arterial thromboembolic disease who underwent the aforementioned  procedure on October 07, 2007.  Her intraoperative course was  unremarkable.  Postoperatively, she had an uneventful course.  Hemoglobin 12.5, creatinine 0.54.  Abdomen was soft with scant vaginal  flow.  Lungs were clear and calves without tenderness.   The patient was discharged home.  Routine postoperative instructions  given with the patient to contact the office for temperature elevation  above 100.4 degrees Fahrenheit, increasing abdominal pain, vaginal  bleeding, pelvic pain, GI or GU complaints.  The patient was asked to  continue her home regimen of Coumadin and was started on Lovenox while  in the hospital and to continue 115 mg twice daily with continuation for  an additional 5 days after the patient reaches a therapeutic PT level.  The patient will return on Monday, January 5 to have her PT level drawn.   The patient was prescribed Darvocet-N 100 1-2 every 4-6h as needed for  in addition to Lovenox.  Will continue her home medications including  Lantus, NovoLog, Topamax, Depakote, Coumadin, Lipitor, Actos, and  Klonopin.  All dosings per her home medication reconciliation discharge  medication order sheet.  The patient will also follow up in the office  in 4 weeks for her postoperative visit.      Ginger Carne, MD  Electronically Signed     SHB/MEDQ  D:  10/08/2007  T:  10/08/2007  Job:  [id number]

## 2011-02-20 NOTE — H&P (Signed)
NAMEDEVETTA, HAGENOW              ACCOUNT NO.:  [id number]   MEDICAL RECORD NO.:  [id number]          PATIENT TYPE:  AMB   LOCATION:  [facility]                           [facility]ACILITY:  [facility]   PHYSICIAN:  Ginger Carne, MD  DATE OF BIRTH:  June 08, 1971   DATE OF ADMISSION:  10/07/2007  DATE OF DISCHARGE:                              HISTORY & PHYSICAL   REASON FOR HOSPITALIZATION:  Menorrhagia, menometrorrhagia.   HISTORY OF PRESENT ILLNESS:  This patient is a 40 year old gravida 0,  para 53 Caucasian female with a five year history of heavy vaginal  bleeding.  The patient bleeds approximately seven to nine days out of a  28 day cycle and sometimes has two menses a month.  She has a history of  pseudotumor cerebri and was not a candidate for use of oral  contraceptive agents.  Patient is on Coumadin, however it is unclear  whether this is a contributory factor to her heavy bleeding.  She has no  personal or family history of bleeding diatheses.  Thrombophilia workup  obtained in 2002 because of a right innominate artery and brachial,  radial and ulnar artery thrombosis revealed no abnormalities.  A  transvaginal ultrasound with SIS reveals no evidence of intracavitary  lesions and/or intramural lesions of the uterus.   OB/GYN HISTORY:  The patient is a nulligravida.   ALLERGIES:  PENICILLIN AND CODEINE.   PAST MEDICAL HISTORY:  1. Bipolar disease.  2. History of a right innominate artery and brachial, radial and ulnar      artery thrombosis in 2002.  3. Type 2 diabetes.   PAST SURGICAL HISTORY:  Thrombectomy of the right innominate artery as  well as the brachial, radial and ulnar arteries.   SOCIAL HISTORY:  The patient smokes one pack of cigarettes a day.  Denies alcohol or illicit drug abuse.   FAMILY HISTORY:  Her father has had lung cancer.  Her mother has had  hypertension and type 2 diabetes.   REVIEW OF SYSTEMS:  A 14 point comprehensive review of systems within  normal  limits.   MEDICATIONS:  1. Depakote ER 500 mg 3 at bedtime.  2. Topamax 200 mg 1 at bedtime.  3. Klonopin 0.5 mg 1 as needed.  4. Lipitor 20 mg 1 daily.  5. Actos 45 mg once at night.  6. Lantus 20 units at bedtime.  7. NovoLog 2 to 5 units during the mid day meal as needed.  8. Coumadin 12.5 mg Friday, Saturday, Sunday, Tuesday and 10 mg of      Coumadin Monday, Wednesday and Thursday.   PHYSICAL EXAMINATION:  VITAL SIGNS:  Blood pressure 116/87, weight 253  pounds, height 5 feet 8 inches.  HEENT: Grossly normal.  BREASTS:  Exam without masses, discharge, thickenings or tenderness.  CHEST:  Clear to percussion and auscultation.  CARDIOVASCULAR:  Exam without murmurs or enlargements.  Regular rate and  rhythm.  EXTREMITIES/LYMPHATIC/SKIN/NEUROLOGICAL/MUSCULOSKELETAL:  Systems within  normal limits.  ABDOMEN:  Soft without gross hepatosplenomegaly.  PELVIC EXAM:  External Genitalia, vulva and vagina normal.  Cervix  smooth without erosions or  lesions.  Uterus palpable to 6 weeks in size,  globular.  Both adnexa palpable and found to be negative.   IMPRESSION:  Menometrorrhagia.   PLAN:  The patient was offered the option of a Mirena IUD and/or an  endometrial thermal balloon ablation which she declined.  Also Depo  Provera 150 mg every 3 months was discussed with the patient including  complications and benefits.  After a thorough discussion with the  patient and considering her nonsurgical options, the patient opted for a  total vaginal hysterectomy with preservation of both tubes and ovaries.  The nature of said procedure was discussed in detail.  Risks including  possible injuries to ureter, bowel, bladder, possible conversion to a  laparoscopic or open procedure, hemorrhage intraoperatively or  postoperatively possibly requiring a blood transfusion, infection,  pulmonary complications and bleeding complications postoperatively were  discussed and understood by said patient.   She understands that she will  have an overlap of Lovenox for full treatment for that period of time  necessary to assure that she has a 5-day period of adequate Coumadin  anticoagulation.  All questions answered to the satisfaction of said  patient and she understands the nature of said procedure including  risks.      Ginger Carne, MD  Electronically Signed     SHB/MEDQ  D:  10/01/2007  T:  10/01/2007  Job:  [id number]

## 2011-02-23 NOTE — Op Note (Signed)
[name], DINKEL                        ACCOUNT NO.:  [id number]   MEDICAL RECORD NO.:  [id number]                   PATIENT TYPE:  AMB   LOCATION:  [facility]                                 FACILITY:  [facility]   PHYSICIAN:  Ginger Carne, MD               DATE OF BIRTH:  23-May-1971   DATE OF PROCEDURE:  06/09/2004  DATE OF DISCHARGE:                                 OPERATIVE REPORT   PREOPERATIVE DIAGNOSIS:  Abnormal uterine bleeding.   POSTOPERATIVE DIAGNOSIS:  Abnormal uterine bleeding.   PROCEDURE:  Dilation and curettage.   SURGEON:  Blima Rich, M.D.   ASSISTANT:  None.   COMPLICATIONS:  None immediate.   ESTIMATED BLOOD LOSS:  Negligible.   ANESTHESIA:  MAC with Marcaine 0.25%.   SPECIMEN:  Endometrial curettings.   OPERATIVE FINDINGS:  External genitalia, vulva, and vagina normal.  The  uterus sounded to 9 cm.  The endocervical canal felt smooth.  The  endometrial cavity demonstrated a smooth contour, slightly enlarged, with  endometrial curettings noted.  Both adnexa palpable, found to be normal.   DESCRIPTION OF PROCEDURE:  The patient prepped and draped in the usual  fashion and placed in the lithotomy position.  Betadine solution used for  antiseptic and the patient was catheterized prior to the procedure.  Afterwards, a tenaculum placed on the anterior lip of the cervix.  Dilatation to accommodate a small curette was utilized.  Specimen to  pathology.  The patient had a paracervical block of 10 mL Marcaine with  epinephrine injected prior to initiation of curettage and dilatation.  Specimen to pathology.  The patient returned to the postanesthesia recovery  room in excellent condition.                                               Ginger Carne, MD    SHB/MEDQ  D:  06/09/2004  T:  06/10/2004  Job:  [id number]

## 2011-02-27 ENCOUNTER — Encounter: Payer: Self-pay | Admitting: *Deleted

## 2011-03-02 ENCOUNTER — Encounter: Payer: Self-pay | Admitting: Internal Medicine

## 2011-04-04 ENCOUNTER — Telehealth: Payer: Self-pay | Admitting: *Deleted

## 2011-04-04 NOTE — Telephone Encounter (Signed)
That is correct She would need assessment now before Rx can be given Please check on her tomorrow to see how she made out

## 2011-04-04 NOTE — Telephone Encounter (Signed)
Left message on machine with detailed results, advised tocall if any questions.   

## 2011-04-04 NOTE — Telephone Encounter (Signed)
Pt is out of town in [place] and states she has a URI like the one she had when she was seen back in October.  She is asking that an antibiotic and cough medicine be called in for her.  I advised her that the doctors will not call in antibiotics unless the pt is seen first.  She wanted to know what meds she was given in October, she will go to an urgent care in her location and will ask for the same meds- advised she was given inhaler, tussionex, z-pack.

## 2011-04-06 ENCOUNTER — Emergency Department: Payer: Self-pay | Admitting: Unknown Physician Specialty

## 2011-04-12 ENCOUNTER — Ambulatory Visit (INDEPENDENT_AMBULATORY_CARE_PROVIDER_SITE_OTHER): Payer: PRIVATE HEALTH INSURANCE | Admitting: Family Medicine

## 2011-04-12 ENCOUNTER — Encounter: Payer: Self-pay | Admitting: Family Medicine

## 2011-04-12 DIAGNOSIS — M25569 Pain in unspecified knee: Secondary | ICD-10-CM

## 2011-04-12 DIAGNOSIS — M25561 Pain in right knee: Secondary | ICD-10-CM

## 2011-04-12 DIAGNOSIS — S43499A Other sprain of unspecified shoulder joint, initial encounter: Secondary | ICD-10-CM

## 2011-04-12 DIAGNOSIS — S46819A Strain of other muscles, fascia and tendons at shoulder and upper arm level, unspecified arm, initial encounter: Secondary | ICD-10-CM

## 2011-04-12 MED ORDER — NAPROXEN 500 MG PO TABS: ORAL_TABLET | ORAL | Status: DC

## 2011-04-12 MED ORDER — CYCLOBENZAPRINE HCL 5 MG PO TABS: 5.0000 mg | ORAL_TABLET | Freq: Two times a day (BID) | ORAL | Status: DC | PRN

## 2011-04-12 NOTE — Assessment & Plan Note (Signed)
Reassured, treat with flexeril as well as ice/heat and rest. Sedation precautions discussed.

## 2011-04-12 NOTE — Patient Instructions (Signed)
Add on flexeril.  Stop ibuprofen, start naprosyn twice daily with food. I think you have trapezius strain on right of neck, flexeril, ice or heat, and rest will help with this. For knee, bad contusion, concern for some ligament injury, will give more time for now. If not improving as expected, or any worsening, let me know for referral to sports medicine doctor or orthopedist.

## 2011-04-12 NOTE — Progress Notes (Signed)
  Subjective:    Patient ID: [name], female    DOB: 22-Dec-1970, 40 y.o.   MRN: [id number]  HPI CC: hosp f/u  Presents as hospital follow up after fall.  Seen at [facility] 04/06/2011, dx with knee contusion, abrasion.  Records reviewed - R shoulder xray - nothing acute.  R knee xray - nothing acute.  DOI: 04/04/2011, tripped over debris.  [place].  Hit R knee and shoulder, thinks brunt of fall landed on shoulder - fall on outstretched hand.    Given ibuprofen and percocets.  Also given bactrim for possible infection?? i don't see this in ER chart but pt brings pill bottle.  has been icing, keeping off.  Wanted to follow up.  Pain getting better, but top of knee feels worse.  Still walking with limp.  Overall getting better.  Feels some stiffness of knee.  Shoulder better.  Review of Systems Per HPI    Objective:   Physical Exam  Nursing note and vitals reviewed. Constitutional: She appears well-developed and well-nourished. No distress.       Walks with limp  Musculoskeletal:       Right shoulder: Normal. She exhibits normal range of motion.       Left shoulder: Normal. She exhibits normal range of motion.       Right knee: She exhibits decreased range of motion (limited flexion to ~40 degr), effusion and ecchymosis. She exhibits no deformity, no laceration, no erythema, normal alignment, no LCL laxity, normal patellar mobility, normal meniscus and no MCL laxity. tenderness found. Medial joint line, lateral joint line (main) and LCL tenderness noted. No patellar tendon tenderness noted.       Left knee: Normal.       Cervical back: She exhibits tenderness (R trap muscle spasm/tightness). She exhibits normal range of motion.       Neg drawer sign, no significant pain with mcmurray's, no laxity with testing of LCL, MCL.  Skin: Skin is warm and dry.          Abrasions bilateral knees R>L Abrasion toes          Assessment & Plan:

## 2011-04-12 NOTE — Assessment & Plan Note (Signed)
Doubt ligament tear or meniscal tear as no laxity on exam today, however there is swelling as well as tenderness and limited flexion making it more difficult to fully examine. ? Bad knee contusion vs ligament injury.  Treat supportively for now with ice, rest, elevation, NSAIDs.  Placed in crutches to limit weight bearing on knee.  F/u 1 1/2 wks.  If worsening to update [place] for referral to ortho or [facility]. Reviewed xray from [facility] ER - no fracture or patellar dislocation/subluxation.

## 2011-04-23 ENCOUNTER — Ambulatory Visit (INDEPENDENT_AMBULATORY_CARE_PROVIDER_SITE_OTHER): Payer: PRIVATE HEALTH INSURANCE | Admitting: Family Medicine

## 2011-04-23 ENCOUNTER — Encounter: Payer: Self-pay | Admitting: Family Medicine

## 2011-04-23 DIAGNOSIS — M25561 Pain in right knee: Secondary | ICD-10-CM

## 2011-04-23 DIAGNOSIS — M25569 Pain in unspecified knee: Secondary | ICD-10-CM

## 2011-04-23 NOTE — Assessment & Plan Note (Addendum)
Anticipate patellar contusion, doubt other pathology as no laxity, no pain with valgus/varus, pain localizes to around patella, inferior at tendon.   Treated with crutches as well as conservative therapy. Provided with stretching/strengthening/rehab exercises from [facility] patient advisor for patellar contusion.

## 2011-04-23 NOTE — Progress Notes (Signed)
  Subjective:    Patient ID: [name], female    DOB: [birth year], 40 y.o.   MRN: [id number]  HPI CC: knee recheck  DOI: 04/04/2011, tripped over debris. [place] train station [place] to [place]. Hit R knee and shoulder, thinks brunt of fall landed on shoulder - fall on outstretched hand.  Also hurt R knee.  Seen at [facility] 04/06/2011, dx with knee contusion, abrasion. Reports reviewed - R shoulder xray - nothing acute. R knee xray - nothing acute.   Seen here 04/12/2011, thought to have trap strain as well as knee contusion vs ligamentous injury, placed in crutches and recommended naprosyn, flexeril, ice, elevation and rest.  Presents for f/u today.  Doing much better.  Not feeling pain, but when flexing knee feels tightness/pulling ant R knee.  Has been using 1 crutch.  Has been keeping elevated and iced.  Using NSAID and muscle relaxant.  Denies locking or catching.  Mild instability of knee.  Neck and shoulder better.  Review of Systems Per HPI    Objective:   Physical Exam  Nursing note and vitals reviewed. Constitutional: She is oriented to person, place, and time. She appears well-developed and well-nourished. No distress.       Ambulating with minimal limp, no crutches  Musculoskeletal: She exhibits no edema.       Right knee: She exhibits decreased range of motion (to ~100 degrees flexion), swelling and ecchymosis. She exhibits no effusion, no deformity, no LCL laxity, normal patellar mobility, normal meniscus and no MCL laxity. tenderness found. Patellar tendon tenderness noted. No medial joint line, no lateral joint line, no MCL and no LCL tenderness noted.       Left knee: Normal.       Legs:      R knee with anterior healing abrasion. Tender distal patellar tendon at insertion of patella. Neg drawer test Neg mcmurrays  Neurological: She is alert and oriented to person, place, and time.  Skin:       Healing abrasions bilat knees and R foot          Assessment &  Plan:

## 2011-04-23 NOTE — Patient Instructions (Addendum)
Good to see you today. Knee is looking better. May stop using crutches, keep elevating and icing when not on your feet. Continue to use anti inflammatory as needed. If any worsening, let us know.

## 2011-05-11 ENCOUNTER — Other Ambulatory Visit (INDEPENDENT_AMBULATORY_CARE_PROVIDER_SITE_OTHER): Payer: PRIVATE HEALTH INSURANCE

## 2011-05-11 DIAGNOSIS — E119 Type 2 diabetes mellitus without complications: Secondary | ICD-10-CM

## 2011-05-11 LAB — HEMOGLOBIN A1C: Hgb A1c MFr Bld: 7.9 % — ABNORMAL HIGH (ref 4.6–6.5)

## 2011-05-16 ENCOUNTER — Telehealth: Payer: Self-pay | Admitting: *Deleted

## 2011-05-16 NOTE — Telephone Encounter (Signed)
Message copied by Sueanne Margarita on Wed May 16, 2011  8:44 AM ------      Message from: Tillman Abide I      Created: Sat May 12, 2011  9:30 AM       Please call      The diabetes control has gotten much better with HgbA1c down to 7.9%      Please continue the current medications and keep scheduled follow up appt (should be in about 3 months)

## 2011-05-16 NOTE — Telephone Encounter (Signed)
.  left message to have patient return my call.  

## 2011-05-17 NOTE — Telephone Encounter (Signed)
Spoke with patient and advised results   

## 2011-06-07 ENCOUNTER — Other Ambulatory Visit: Payer: Self-pay | Admitting: *Deleted

## 2011-06-07 MED ORDER — DIVALPROEX SODIUM ER 500 MG PO TB24: 1500.0000 mg | ORAL_TABLET | Freq: Every day | ORAL | Status: DC

## 2011-06-07 NOTE — Telephone Encounter (Signed)
rx printed to be signed Left message on machine that rx is ready for pick-up, and it will be at our front desk.

## 2011-06-07 NOTE — Telephone Encounter (Signed)
Pt states when she picked up her dopakote refill on 8/8 it was filled with generic and she is not doing well with this- causing insomnia. Pt would like a new script for brand only sent to [facility]

## 2011-06-07 NOTE — Telephone Encounter (Signed)
Okay to change to brand only Make not that she failed trial with generic

## 2011-06-08 ENCOUNTER — Other Ambulatory Visit: Payer: Self-pay | Admitting: *Deleted

## 2011-06-08 ENCOUNTER — Ambulatory Visit (INDEPENDENT_AMBULATORY_CARE_PROVIDER_SITE_OTHER): Payer: PRIVATE HEALTH INSURANCE | Admitting: *Deleted

## 2011-06-08 ENCOUNTER — Ambulatory Visit: Payer: PRIVATE HEALTH INSURANCE

## 2011-06-08 DIAGNOSIS — Z23 Encounter for immunization: Secondary | ICD-10-CM

## 2011-06-08 MED ORDER — DIVALPROEX SODIUM ER 500 MG PO TB24: 1500.0000 mg | ORAL_TABLET | Freq: Every day | ORAL | Status: DC

## 2011-07-13 LAB — BASIC METABOLIC PANEL
BUN: 5 — ABNORMAL LOW
BUN: 8
Calcium: 8.3 — ABNORMAL LOW
Creatinine, Ser: 0.54
Creatinine, Ser: 0.76
GFR calc Af Amer: >60
GFR calc Af Amer: >60
GFR calc non Af Amer: >60
GFR calc non Af Amer: >60
Potassium: 4.2

## 2011-07-13 LAB — CBC
Hemoglobin: 14.8
Platelets: 161
Platelets: 220
RBC: 3.91
RDW: 13.2
WBC: 12.3 — ABNORMAL HIGH

## 2011-07-13 LAB — PROTIME-INR
INR: 0.9
Prothrombin Time: 12.3

## 2011-08-17 ENCOUNTER — Encounter: Payer: Self-pay | Admitting: Internal Medicine

## 2011-08-17 ENCOUNTER — Ambulatory Visit (INDEPENDENT_AMBULATORY_CARE_PROVIDER_SITE_OTHER): Payer: PRIVATE HEALTH INSURANCE | Admitting: Internal Medicine

## 2011-08-17 VITALS — BP 130/90 | HR 84 | Temp 98.6°F | Ht 68.0 in | Wt 264.0 lb

## 2011-08-17 DIAGNOSIS — E119 Type 2 diabetes mellitus without complications: Secondary | ICD-10-CM

## 2011-08-17 DIAGNOSIS — I10 Essential (primary) hypertension: Secondary | ICD-10-CM

## 2011-08-17 DIAGNOSIS — Z23 Encounter for immunization: Secondary | ICD-10-CM

## 2011-08-17 DIAGNOSIS — F319 Bipolar disorder, unspecified: Secondary | ICD-10-CM

## 2011-08-17 DIAGNOSIS — F17200 Nicotine dependence, unspecified, uncomplicated: Secondary | ICD-10-CM

## 2011-08-17 MED ORDER — FLUCONAZOLE 150 MG PO TABS: 150.0000 mg | ORAL_TABLET | Freq: Every day | ORAL | Status: DC

## 2011-08-17 MED ORDER — KETOCONAZOLE 2 % EX CREA: TOPICAL_CREAM | Freq: Two times a day (BID) | CUTANEOUS | Status: AC

## 2011-08-17 NOTE — Progress Notes (Signed)
Subjective:    Patient ID: [name], female    DOB: 09/08/1971, 40 y.o.   MRN: [id number]  HPI Not ready to stop smoking yet Did give info on 1-800 Quit-now  Had made some definite lifestyle changes at last blood work Now having more trouble---grabbing food on the fly School and working part time Mom is living with her now --just more stress  Not checking sugars as often Was 176 this AM Not under 150 in a while  Mood has been fine No no mania or depression  No chest pain No SOB Hasn't been able to walk as much  Current Outpatient Prescriptions on File Prior to Visit  Medication Sig Dispense Refill  . atorvastatin (LIPITOR) 20 MG tablet Take 20 mg by mouth daily.        . divalproex (DEPAKOTE) 500 MG 24 hr tablet Take 500 mg by mouth daily.        . divalproex (DEPAKOTE) 500 MG 24 hr tablet Take 3 tablets (1,500 mg total) by mouth at bedtime.  270 tablet  3  . glipiZIDE (GLUCOTROL) 10 MG 24 hr tablet Take 1 tablet (10 mg total) by mouth daily.  30 tablet  11  . insulin aspart (NOVOLOG FLEXPEN) 100 UNIT/ML injection Inject into the skin. 5 units before every meal. Increase as needed per the sliding scale       . insulin glargine (LANTUS OPTICLIK) 100 UNIT/ML injection Inject into the skin. 35 units injected nightly       . pioglitazone (ACTOS) 45 MG tablet Take 45 mg by mouth daily.        . rizatriptan (MAXALT) 10 MG tablet Take 10 mg by mouth. 1 at onset of migraine headache         Allergies  Allergen Reactions  . Metformin     REACTION: GI problems  . Penicillins     REACTION: rash    Past Medical History  Diagnosis Date  . Diabetes mellitus     Type II  . DVT (deep venous thrombosis) 2002    right arm and PE Work up negative   . Seizures   . Bipolar disorder   . Eye disorder     inflammatory eye disorder   . Migraines   . Lumbar stenosis     disk disease   . Meningitis 6    with seizure    Past Surgical History  Procedure Date  . Other  surgical history 2008    hysterectomy- Dr. Mia Creek     Family History  Problem Relation Age of Onset  . Diabetes Mother   . Hypertension Mother   . Cancer Father     lung   . Coronary artery disease Paternal Grandmother     History   Social History  . Marital Status: Married    Spouse Name: N/A    Number of Children: 0  . Years of Education: N/A   Occupational History  . CNA and hopefully Environmental health practitioner         Social History Main Topics  . Smoking status: Current Everyday Smoker  . Smokeless tobacco: Never Used   Comment: quit but restarted. Plans to stop again with husband and commt lozenges   . Alcohol Use: Yes     rare- wine   . Drug Use: Not on file  . Sexually Active: Not on file   Other Topics Concern  . Not on file   Social History Narrative   Cares  for her mom who lives with her --Ferman Hamming   Review of Systems Weight is fairly stable for some time Sleeping okay Has had recurrent vaginal yeast infections--also some yeast in belly button     Objective:   Physical Exam  Constitutional: She appears well-developed and well-nourished. No distress.  Neck: Normal range of motion. Neck supple. No thyromegaly present.  Cardiovascular: Normal rate, regular rhythm and normal heart sounds.  Exam reveals no gallop.   No murmur heard. Pulmonary/Chest: Effort normal and breath sounds normal. No respiratory distress. She has no wheezes. She has no rales.  Musculoskeletal: She exhibits no edema and no tenderness.  Lymphadenopathy:    She has no cervical adenopathy.  Skin:       Tinea in umbilicus  Psychiatric: She has a normal mood and affect. Her behavior is normal. Judgment and thought content normal.          Assessment & Plan:

## 2011-08-17 NOTE — Assessment & Plan Note (Signed)
Not ready to quit but gave info

## 2011-08-17 NOTE — Assessment & Plan Note (Signed)
Mood has been level on the depakote

## 2011-08-17 NOTE — Assessment & Plan Note (Signed)
Lab Results  Component Value Date   HGBA1C 7.9* 05/11/2011   Did bring it down Slipping on lifestyle issues Will check at PE She will work on lifestyle

## 2011-08-17 NOTE — Assessment & Plan Note (Signed)
BP Readings from Last 3 Encounters:  08/17/11 130/90  04/23/11 116/80  04/12/11 110/68   Fair control No changes

## 2011-12-10 ENCOUNTER — Other Ambulatory Visit: Payer: Self-pay | Admitting: *Deleted

## 2011-12-10 MED ORDER — PIOGLITAZONE HCL 45 MG PO TABS: 45.0000 mg | ORAL_TABLET | Freq: Every day | ORAL | Status: DC

## 2011-12-10 MED ORDER — ATORVASTATIN CALCIUM 20 MG PO TABS: 20.0000 mg | ORAL_TABLET | Freq: Every day | ORAL | Status: AC

## 2012-02-08 ENCOUNTER — Other Ambulatory Visit: Payer: Self-pay | Admitting: Internal Medicine

## 2012-02-18 ENCOUNTER — Ambulatory Visit (INDEPENDENT_AMBULATORY_CARE_PROVIDER_SITE_OTHER): Payer: PRIVATE HEALTH INSURANCE | Admitting: Internal Medicine

## 2012-02-18 ENCOUNTER — Encounter: Payer: Self-pay | Admitting: Internal Medicine

## 2012-02-18 VITALS — BP 118/78 | HR 79 | Temp 97.9°F | Ht 68.0 in | Wt 261.0 lb

## 2012-02-18 DIAGNOSIS — E119 Type 2 diabetes mellitus without complications: Secondary | ICD-10-CM

## 2012-02-18 DIAGNOSIS — I10 Essential (primary) hypertension: Secondary | ICD-10-CM

## 2012-02-18 DIAGNOSIS — F319 Bipolar disorder, unspecified: Secondary | ICD-10-CM

## 2012-02-18 DIAGNOSIS — E785 Hyperlipidemia, unspecified: Secondary | ICD-10-CM

## 2012-02-18 DIAGNOSIS — Z0000 Encounter for general adult medical examination without abnormal findings: Secondary | ICD-10-CM | POA: Insufficient documentation

## 2012-02-18 MED ORDER — INSULIN ASPART 100 UNIT/ML ~~LOC~~ SOLN: 35.0000 [IU] | Freq: Every day | SUBCUTANEOUS | Status: DC

## 2012-02-18 NOTE — Assessment & Plan Note (Signed)
Needs to work on fitness but doing okay mammo again

## 2012-02-18 NOTE — Assessment & Plan Note (Signed)
controlled with the depakote Will check levels

## 2012-02-18 NOTE — Assessment & Plan Note (Signed)
BP Readings from Last 3 Encounters:  02/18/12 118/78  08/17/11 130/90  04/23/11 116/80   Good control

## 2012-02-18 NOTE — Progress Notes (Signed)
Subjective:    Patient ID: [name], female    DOB: 25-Sep-1971, 41 y.o.   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 atorvastatin (LIPITOR) 20 MG tablet Take 1 tablet (20 mg total) by mouth daily.  90 tablet  3  . divalproex (DEPAKOTE) 500 MG 24 hr tablet Take 3 tablets (1,500 mg total) by mouth at bedtime.  270 tablet  3  . glipiZIDE (GLUCOTROL) 10 MG 24 hr tablet Take 1 tablet (10 mg total) by mouth daily.  30 tablet  11  . ketoconazole (NIZORAL) 2 % cream Apply topically 2 (two) times daily. For yeast rash  60 g  0  . LANTUS SOLOSTAR 100 UNIT/ML injection INJECT 35 UNITS NIGHTLY  45 Syringe  1  . pioglitazone (ACTOS) 45 MG tablet Take 1 tablet (45 mg total) by mouth daily.  90 tablet  3  . rizatriptan (MAXALT) 10 MG tablet Take 10 mg by mouth. 1 at onset of migraine headache       . DISCONTD: NOVOLOG FLEXPEN 100 UNIT/ML injection INJECT 5 UNITS BEFORE EVERY MEAL INCREASE AS NEEDED PER SLIDING SCALE  15 Syringe  1  . DISCONTD: divalproex (DEPAKOTE) 500 MG 24 hr tablet Take 500 mg by mouth daily.          Allergies  Allergen Reactions  . Metformin     REACTION: GI problems  . Penicillins     REACTION: rash    Past Medical History  Diagnosis Date  . Diabetes mellitus     Type II  . DVT (deep venous thrombosis) 2002    right arm and PE Work up negative   . Bipolar disorder   . Eye disorder     inflammatory eye disorder   . Migraines   . Lumbar stenosis     disk disease   . Meningitis 6    with seizure    Past Surgical History  Procedure Date  . Other  surgical history 2008    hysterectomy- Dr. Mia Creek     Family History  Problem Relation Age of Onset  . Diabetes Mother   . Hypertension Mother   . Cancer Father     lung   . Coronary artery disease Paternal Grandmother     History   Social History  . Marital Status: Married    Spouse Name: N/A    Number of Children: 0  . Years of Education: N/A   Occupational History  . Student--hopefully nursing in fall 2014        .     Social History Main Topics  . Smoking status: Current Everyday Smoker  . Smokeless tobacco: Never Used   Comment: quit but restarted. Plans to stop again with husband and commt lozenges   . Alcohol Use: Yes     rare- wine   . Drug Use: Not on file  . Sexually Active: Not on file   Other Topics Concern  . Not on file   Social History Narrative   Cares for her mom who lives with  her --Ferman Hamming   Review of Systems  Constitutional: Negative for fatigue and unexpected weight change.       Wears seat belt  HENT: Positive for congestion and rhinorrhea. Negative for hearing loss and tinnitus.        Allergies are intermittent---allegra in past Regular with dentist  Eyes: Negative for visual disturbance.       Due for eye exam  Respiratory: Negative for cough, chest tightness and shortness of breath.   Cardiovascular: Positive for palpitations. Negative for chest pain and leg swelling.       Rare palpitations from nerves  Gastrointestinal: Negative for nausea, vomiting, abdominal pain, constipation and blood in stool.       No heartburn  Genitourinary: Negative for urgency, difficulty urinating and dyspareunia.  Musculoskeletal: Positive for back pain. Negative for joint swelling and arthralgias.       Intermittent low back pain---motrin helps  Skin: Negative for rash.       No suspicious areas  Neurological: Positive for headaches. Negative for dizziness, syncope, weakness, light-headedness and numbness.       Headaches occ---not as  bad. Uses maxalt for migraine  Hematological: Negative for adenopathy. Does not bruise/bleed easily.  Psychiatric/Behavioral: Negative for sleep disturbance and dysphoric mood. The patient is not nervous/anxious.        Takes a while to initiate sleep---then does fine       Objective:   Physical Exam  Constitutional: She is oriented to person, place, and time. She appears well-developed and well-nourished. No distress.  HENT:  Head: Normocephalic and atraumatic.  Right Ear: External ear normal.  Left Ear: External ear normal.  Mouth/Throat: Oropharynx is clear and moist. No oropharyngeal exudate.  Eyes: Conjunctivae and EOM are normal. Pupils are equal, round, and reactive to light.  Neck: Normal range of motion. Neck supple. No thyromegaly present.  Cardiovascular: Normal rate, regular rhythm, normal heart sounds and intact distal pulses.  Exam reveals no gallop.   No murmur heard. Pulmonary/Chest: Effort normal and breath sounds normal. No respiratory distress. She has no wheezes. She has no rales.  Abdominal: Soft. There is no tenderness.  Genitourinary:       Mild cystic changes in both breasts without worrisome masses  Musculoskeletal: Normal range of motion. She exhibits no edema and no tenderness.  Lymphadenopathy:    She has no cervical adenopathy.    She has no axillary adenopathy.  Neurological: She is alert and oriented to person, place, and time.  Skin: No rash noted. No erythema.  Psychiatric: She has a normal mood and affect. Her behavior is normal. Thought content normal.          Assessment & Plan:

## 2012-02-18 NOTE — Assessment & Plan Note (Signed)
Due for labs

## 2012-02-18 NOTE — Assessment & Plan Note (Signed)
Lab Results  Component Value Date   HGBA1C 7.9* 05/11/2011   Control seems to have slipped If over 8%, will increase lantus (now 35-40 per day)

## 2012-02-19 LAB — LIPID PANEL
HDL: 42.6 mg/dL (ref >39.00)
Triglycerides: 150 mg/dL — ABNORMAL HIGH (ref 0.0–149.0)

## 2012-02-19 LAB — BASIC METABOLIC PANEL
CO2: 26 mEq/L (ref 19–32)
Calcium: 9 mg/dL (ref 8.4–10.5)
GFR: 134.35 mL/min (ref >60.00)
Glucose, Bld: 130 mg/dL — ABNORMAL HIGH (ref 70–99)
Potassium: 4.3 mEq/L (ref 3.5–5.1)
Sodium: 139 mEq/L (ref 135–145)

## 2012-02-19 LAB — CBC WITH DIFFERENTIAL/PLATELET
Basophils Absolute: 0 10*3/uL (ref 0.0–0.1)
Basophils Relative: 0.1 % (ref 0.0–3.0)
HCT: 46.9 % — ABNORMAL HIGH (ref 36.0–46.0)
Hemoglobin: 15.3 g/dL — ABNORMAL HIGH (ref 12.0–15.0)
Lymphocytes Relative: 36.9 % (ref 12.0–46.0)
Lymphs Abs: 3.7 10*3/uL (ref 0.7–4.0)
MCHC: 32.6 g/dL (ref 30.0–36.0)
Monocytes Relative: 4.3 % (ref 3.0–12.0)
Neutro Abs: 5.6 10*3/uL (ref 1.4–7.7)
RBC: 5 Mil/uL (ref 3.87–5.11)
RDW: 13.4 % (ref 11.5–14.6)

## 2012-02-19 LAB — HEPATIC FUNCTION PANEL
ALT: 31 U/L (ref 0–35)
Albumin: 3.8 g/dL (ref 3.5–5.2)
Alkaline Phosphatase: 72 U/L (ref 39–117)
Bilirubin, Direct: 0.1 mg/dL (ref 0.0–0.3)
Total Protein: 7.7 g/dL (ref 6.0–8.3)

## 2012-02-19 LAB — VALPROIC ACID LEVEL: Valproic Acid Lvl: 42 ug/mL — ABNORMAL LOW (ref 50.0–100.0)

## 2012-02-19 LAB — HEMOGLOBIN A1C: Hgb A1c MFr Bld: 11 % — ABNORMAL HIGH (ref 4.6–6.5)

## 2012-02-19 LAB — MICROALBUMIN / CREATININE URINE RATIO: Microalb Creat Ratio: 1.3 mg/g (ref 0.0–30.0)

## 2012-02-26 ENCOUNTER — Encounter: Payer: Self-pay | Admitting: *Deleted

## 2012-02-28 ENCOUNTER — Ambulatory Visit: Payer: Self-pay | Admitting: Internal Medicine

## 2012-02-29 ENCOUNTER — Encounter: Payer: Self-pay | Admitting: Internal Medicine

## 2012-03-07 ENCOUNTER — Encounter: Payer: Self-pay | Admitting: *Deleted

## 2012-08-15 ENCOUNTER — Ambulatory Visit: Payer: PRIVATE HEALTH INSURANCE | Admitting: Internal Medicine

## 2012-09-12 ENCOUNTER — Ambulatory Visit: Payer: PRIVATE HEALTH INSURANCE | Admitting: Internal Medicine

## 2012-09-23 ENCOUNTER — Ambulatory Visit (INDEPENDENT_AMBULATORY_CARE_PROVIDER_SITE_OTHER): Payer: PRIVATE HEALTH INSURANCE | Admitting: Internal Medicine

## 2012-09-23 ENCOUNTER — Encounter: Payer: Self-pay | Admitting: Internal Medicine

## 2012-09-23 VITALS — BP 120/80 | HR 70 | Temp 98.1°F | Ht 68.0 in | Wt 276.0 lb

## 2012-09-23 DIAGNOSIS — E785 Hyperlipidemia, unspecified: Secondary | ICD-10-CM

## 2012-09-23 DIAGNOSIS — I10 Essential (primary) hypertension: Secondary | ICD-10-CM

## 2012-09-23 DIAGNOSIS — F319 Bipolar disorder, unspecified: Secondary | ICD-10-CM

## 2012-09-23 MED ORDER — INSULIN PEN NEEDLE 29G X 12.7MM MISC: Status: AC

## 2012-09-23 MED ORDER — INSULIN GLARGINE 100 UNIT/ML ~~LOC~~ SOLN: 40.0000 [IU] | Freq: Every day | SUBCUTANEOUS | Status: DC

## 2012-09-23 MED ORDER — FLUCONAZOLE 150 MG PO TABS: 150.0000 mg | ORAL_TABLET | Freq: Every day | ORAL | Status: AC

## 2012-09-23 MED ORDER — DIVALPROEX SODIUM ER 500 MG PO TB24: 1500.0000 mg | ORAL_TABLET | Freq: Every day | ORAL | Status: DC

## 2012-09-23 NOTE — Progress Notes (Signed)
Subjective:    Patient ID: [name], female    DOB: [birth year], 41 y.o.   MRN: [id number]  HPI Still not doing well with diet Tough chemistry class---lots of stress eating Limited with testing sugars due to class schedule so not using novolog regularly  Weight is up 15#--mostly due to her eating  Checks sugar intermittently When she does check--it has been high No hypoglycemia  Mood has been fine No mania but will get some racing thoughts at the end of the day  No chest pain  No SOB  Current Outpatient Prescriptions on File Prior to Visit  Medication Sig Dispense Refill  . atorvastatin (LIPITOR) 20 MG tablet Take 1 tablet (20 mg total) by mouth daily.  90 tablet  3  . divalproex (DEPAKOTE) 500 MG 24 hr tablet Take 3 tablets (1,500 mg total) by mouth at bedtime.  270 tablet  3  . glipiZIDE (GLUCOTROL) 10 MG 24 hr tablet Take 1 tablet (10 mg total) by mouth daily.  30 tablet  11  . insulin aspart (NOVOLOG) 100 UNIT/ML injection Inject 5-10 Units into the skin 3 (three) times daily before meals. As needed for high sugars      . insulin glargine (LANTUS SOLOSTAR) 100 UNIT/ML injection Inject 40 Units into the skin daily.       . pioglitazone (ACTOS) 45 MG tablet Take 1 tablet (45 mg total) by mouth daily.  90 tablet  3  . rizatriptan (MAXALT) 10 MG tablet Take 10 mg by mouth. 1 at onset of migraine headache         Allergies  Allergen Reactions  . Metformin     REACTION: GI problems  . Penicillins     REACTION: rash    Past Medical History  Diagnosis Date  . Diabetes mellitus     Type II  . DVT (deep venous thrombosis) 2002    right arm and PE Work up negative   . Bipolar disorder   . Eye disorder     inflammatory eye disorder   . Migraines   . Lumbar stenosis     disk disease   . Meningitis 6    with seizure    Past Surgical History  Procedure Date  . Other surgical history 2008    hysterectomy- Dr. Mia Creek     Family History  Problem Relation Age  of Onset  . Diabetes Mother   . Hypertension Mother   . Cancer Father     lung   . Coronary artery disease Paternal Grandmother     History   Social History  . Marital Status: Married    Spouse Name: N/A    Number of Children: 0  . Years of Education: N/A   Occupational History  . Student--hopefully nursing in fall 2014        .     Social History Main Topics  . Smoking status: Current Every Day Smoker  . Smokeless tobacco: Never Used     Comment: quit but restarted. Plans to stop again with husband and commt lozenges   . Alcohol Use: Yes     Comment: rare- wine   . Drug Use: Not on file  . Sexually Active: Not on file   Other Topics Concern  . Not on file   Social History Narrative   Cares for her mom who lives with her --Ferman Hamming   Review of Systems Mild sinus symptoms Congestion in ear Generally sleeps okay  Objective:   Physical Exam  Constitutional: She appears well-developed and well-nourished. No distress.  Neck: Normal range of motion. Neck supple. No thyromegaly present.  Cardiovascular: Normal rate, regular rhythm, normal heart sounds and intact distal pulses.  Exam reveals no gallop.   No murmur heard. Pulmonary/Chest: Effort normal and breath sounds normal. No respiratory distress. She has no wheezes. She has no rales.  Musculoskeletal: She exhibits no edema and no tenderness.  Lymphadenopathy:    She has no cervical adenopathy.  Psychiatric: She has a normal mood and affect. Her behavior is normal.          Assessment & Plan:

## 2012-09-23 NOTE — Assessment & Plan Note (Signed)
Clearly seems worse Will recheck A1c but also needs endocrine referral

## 2012-09-23 NOTE — Assessment & Plan Note (Signed)
BP Readings from Last 3 Encounters:  09/23/12 120/80  02/18/12 118/78  08/17/11 130/90   Fortunately this has been well controlled without meds

## 2012-09-23 NOTE — Assessment & Plan Note (Signed)
Lab Results  Component Value Date   LDLCALC 71 02/18/2012   No problems with med Labs for this next time

## 2012-09-23 NOTE — Assessment & Plan Note (Signed)
Has been controlled despite all her stress No changes needed

## 2012-09-24 ENCOUNTER — Encounter: Payer: Self-pay | Admitting: *Deleted

## 2012-10-13 ENCOUNTER — Encounter: Payer: Self-pay | Admitting: Internal Medicine

## 2012-10-17 ENCOUNTER — Ambulatory Visit (INDEPENDENT_AMBULATORY_CARE_PROVIDER_SITE_OTHER): Payer: PRIVATE HEALTH INSURANCE | Admitting: Internal Medicine

## 2012-10-17 ENCOUNTER — Encounter: Payer: Self-pay | Admitting: Internal Medicine

## 2012-10-17 VITALS — BP 124/80 | HR 96 | Temp 98.3°F | Resp 16 | Ht 67.5 in | Wt 277.0 lb

## 2012-10-17 DIAGNOSIS — IMO0001 Reserved for inherently not codable concepts without codable children: Secondary | ICD-10-CM

## 2012-10-17 MED ORDER — METFORMIN HCL 500 MG PO TABS: 1000.0000 mg | ORAL_TABLET | Freq: Two times a day (BID) | ORAL | Status: DC

## 2012-10-17 MED ORDER — INSULIN ASPART 100 UNIT/ML ~~LOC~~ SOLN: SUBCUTANEOUS | Status: DC

## 2012-10-17 NOTE — Progress Notes (Signed)
Subjective:     Patient ID: [name], female   DOB: 10/09/1970, 42 y.o.   MRN: [id number]  HPI Dx with DM2 10 years ago. Has been on Insulin for 5 years, was on NPH initially, for the past 6 mo-1 year on higher dose of Lantus. She has tried Metformin - nausea and bad taste. She is also on Actos 45 mg daily and Glipizide XL - did not take it recently. She is taking night classes now. She is taking care of her mom.  She has occasional blurry vision, no increased thirst (but drinks a lot of water), no increased urination or nocturia. She goes to bed at 11 pm and wakes up when husband comes home at 3 am then gets back to sleep. She wakes up at 10. No numbness and tingling.   FH of DM in mother but controlled after weight loss. She was eating fast food in last 2 semesters but now pt more at home and able to plan her meal. She is studying to become a nurse.  Checks sugars - twice a week: - am: 190's-200's - before lunch: not checked - before dinner: not checked  No lows, but has hgly awareness.  Novolog 5-10   Review of Systems     Objective:   Physical Exam     Assessment:         Plan:     -

## 2012-10-17 NOTE — Patient Instructions (Addendum)
Please increase the Lantus dose to 45 units at night. Please increase the Novolog with every meal to 10 units. Stop taking your Glipizide and Actos. Start Metformin 500 mg with dinner. Take this for 1 week, if you tolerate it well, add 500 mg with breakfast. Take this for 1 week, if you tolerate it well, add 500 mg with dinner (total 1000 mg). Take this for 1 week, if you tolerate it well, add 500 mg with breakfast (total 1000 mg)  Consider the following changes in your diet:  - substitute whole grain for white bread or pasta - substitute brown rice for white rice - substitute 90-calorie flat bread pieces for slices of bread when possible - substitute sweet potatoes or yams for white potatoes - substitute humus for margarine - substitute tofu for cheese when possible - substitute almond or rice milk for regular milk (would not drink soy milk daily due to concern for soy estrogen influence on breast cancer risk) - substitute dark chocolate for other sweets when possible - substitute water - can add lemon or orange slices for taste - for diet sodas (artificial sweeteners will trick your body that you can eat sweets without getting calories and will lead you to overeating and weight gain in the long run) - do not skip breakfast or other meals (this will slow down the metabolism and will result in more weight gain over time)  - can try smoothies made from fruit and almond/rice milk in am instead of regular breakfast - can also try old-fashioned (not instant) oatmeal made with almond/rice milk in am - order the dressing on the side when eating salad at a restaurant - eat as little meat as possible - can try juicing, but should not forget that juicing will get rid of the fiber, so would alternate with eating raw veg./fruits or drinking smoothies - use as little oil as possible, even when using olive oil - can dress a salad with a mix of balsamic vinegar and lemon juice, for e.g. - use agave nectar,  stevia sugar, or regular sugar rather than artificial sweateners - steam or broil/roast veggies  - snack on veggies/fruit/nuts (unsalted, preferably) when possible, rather than processed foods - reduce or eliminate aspartame in diet (it is in diet sodas, chewing gum, etc) Read the labels!   Please consider a plant-based diet.  To help you with this, you can start by watching/reading the following: - lectures (you tube):  Lequita Asal: "Breaking the Food Seduction"  Doug Lisle: "How to Lose Weight, without Losing Your Mind"  Lucile Crater: "What is Insulin Resistance" [email or web address] - documentaries:  Sup[employer]  Forks over [employer], Sick and Nearly Dead  [employer] - books:  Lequita Asal: "Program for Reversing Diabetes"  Ferol Luz: "The [place] Study"  Konrad Penta: "Supermarket Vegan" (cookbook)

## 2012-10-17 NOTE — Progress Notes (Signed)
Subjective:     Patient ID: [name], female   DOB: 1971-02-12, 42 y.o.   MRN: [id number]  HPI [name] is a pleasant 42 y/o woman referred by PCP, Dr. Alphonsus Sias, for management of DM2, uncontrolled, insulin-dependent, unknown complications.  Dx with DM2 10 years ago. Has been on Insulin for 5 years, was on NPH initially, for the past 6 mo-1 year on higher dose of Lantus. She has tried Metformin - nausea and bad taste, cannot remember if she was started on the max. dose directly. She is on: - lantus 40 units qhs - Actos 45 mg daily  - Glipizide XL - did not take it recently as she felt bad on this while at school.  - Novolog 5-10 with meals, not always checking sugars before meals because of her schedule, but will start to check now that her schedule changed and she is at home more. 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 She is also taking care of her mom.  She had increased weight gain (15 lbs in last 7 mo). She has occasional blurry vision, no increased thirst (but drinks a lot of water), no increased urination or nocturia. She goes to bed at 11 pm and wakes up when husband comes home at 3 am then gets back to sleep. She wakes up at 10. No numbness and tingling.   FH of DM in mother but controlled after weight loss. She was eating fast food in last 2 semesters but now pt more at home and able to plan her meal.  Checks sugars - twice a week: - am: 190's-200's - before lunch: not checked - before dinner: not checked  No lows, but has hgly awareness.  She had an eye exam a year ago, reportedly normal. No CKD. No neuropathy. Lab Results  Component Value Date   MICROALBUR 0.7 02/18/2012   She has a FH of DM2 in mother.   Review of Systems Constitutional: +  weight gain/loss, no fatigue, no subjective hyperthermia/hypothermia, increased appetite Eyes: occasional blurry vision, no xerophthalmia ENT: no sore throat, no nodules palpated in throat, no  dysphagia/odynophagia, no hoarseness Cardiovascular: no CP/SOB/palpitations/leg swelling Respiratory: no cough/SOB Gastrointestinal: no N/V/D/C Musculoskeletal: + muscle cahes/no joint aches Skin: no rashes Neurological: no tremors/numbness/tingling/dizziness, + HAs Psychiatric: no depression/anxiety  Past Medical History  Diagnosis Date  . Diabetes mellitus     Type II  . DVT (deep venous thrombosis) 2002    right arm and PE Work up negative   . Bipolar disorder   . Eye disorder     inflammatory eye disorder   . Migraines   . Lumbar stenosis     disk disease   . Meningitis 7    with seizure   Past Surgical History  Procedure Date  . Other surgical history 2008    hysterectomy- Dr. Mia Creek   . Abdominal hysterectomy 2009   History   Social History  . Marital Status: Married    Spouse Name: N/A    Number of Children: 0  . Years of Education: N/A   Occupational History  . Student--hopefully nursing in fall 2014        .     Social History Main Topics  . Smoking status: Current Every Day Smoker  . Smokeless tobacco: Never Used     Comment: quit but restarted. Plans to stop again with husband and commt lozenges   . Alcohol Use: No     Comment:  rare- wine   . Drug Use: No  . Sexually Active: Yes   Other Topics Concern  . Not on file   Social History Narrative   Cares for her mom who lives with her --Roxan Diesel CarpenterMarriedNo childrenPart time studentSome exersize   Current Outpatient Prescriptions on File Prior to Visit  Medication Sig Dispense Refill  . atorvastatin (LIPITOR) 20 MG tablet Take 1 tablet (20 mg total) by mouth daily.  90 tablet  3  . divalproex (DEPAKOTE ER) 500 MG 24 hr tablet Take 3 tablets (1,500 mg total) by mouth at bedtime.  270 tablet  3  . insulin aspart (NOVOLOG FLEXPEN) 100 UNIT/ML injection Inject under skin 10 units 3 x a day  15 pen  4  . insulin glargine (LANTUS SOLOSTAR) 100 UNIT/ML injection Inject 40 Units into the skin  daily.  15 pen  3  . Insulin Pen Needle (BD ULTRA-FINE PEN NEEDLES) 29G X 12.7MM MISC Use 3 times daily to inject insulin  300 each  3  . rizatriptan (MAXALT) 10 MG tablet  Glipizide XL 10 mg  Actos 45 mg Take 10 mg by mouth. 1 at onset of migraine headache          Allergies  Allergen Reactions  . Metformin     REACTION: GI problems  . Penicillins     REACTION: rash  . Prednisone Other (See Comments)   Family History  Problem Relation Age of Onset  . Diabetes Mother   . Hypertension Mother   . Cancer Father     lung   . Coronary artery disease Paternal Grandmother    Objective:   Physical Exam BP 124/80  Pulse 96  Temp 98.3 F (36.8 C) (Oral)  Resp 16  Ht 5' 7.5" (1.715 m)  Wt 277 lb (125.646 kg)  BMI 42.74 kg/m2  SpO2 97% Wt Readings from Last 3 Encounters:  10/17/12 277 lb (125.646 kg)  09/23/12 276 lb (125.193 kg)  02/18/12 261 lb (118.389 kg)   Constitutional: obese, in NAD Eyes: PERRLA, EOMI, no exophthalmos ENT: moist mucous membranes, no thyromegaly, no cervical lymphadenopathy Cardiovascular: RRR, No MRG Respiratory: CTA B Gastrointestinal: abdomen soft, NT, ND, BS+ Musculoskeletal: no deformities, strength intact in all 4 Skin: moist, warm, no rashes Neurological: no tremor with outstretched hands, DTR normal in all 4  Assessment:     1. DM2, uncontrolled, insulin-dependent - no retinopathy, reportedly - no CKD, no MAU  Plan:     Pt with uncontrolled DM, unknown if complications. - we had a long discussion about healthy eating and I recommended a plant-based diet. I discussed ways to start the diet, alternatives to milk, cheese, etc and gave her reference materials for this - please see pt information sheet. Pt appears interested in this and will try it. - will d/c Actos and Glipizide - explained SEs of each - will increase Novolog mealtime to 10 units tid - I did not add a correction scale to this, as I do not have sugars now, might add this  in a month when pt returns with her log - will retry Metformin - start at low dose and work up slowly to target dose of 2000 mg daily  - given sugar log and advised how to fill it and to bring it at next appt - given foot care handout and explained the principles - given instructions for hypoglycemia management "15-15 rule" - I will see her back in 1 mo with her log - no  labs for today

## 2012-11-17 ENCOUNTER — Ambulatory Visit (INDEPENDENT_AMBULATORY_CARE_PROVIDER_SITE_OTHER): Payer: PRIVATE HEALTH INSURANCE | Admitting: Internal Medicine

## 2012-11-17 ENCOUNTER — Encounter: Payer: Self-pay | Admitting: Internal Medicine

## 2012-11-17 VITALS — BP 126/84 | HR 99 | Temp 98.0°F | Resp 16 | Ht 67.0 in | Wt 272.0 lb

## 2012-11-17 DIAGNOSIS — E119 Type 2 diabetes mellitus without complications: Secondary | ICD-10-CM

## 2012-11-17 NOTE — Patient Instructions (Addendum)
GREAT JOB!!!!!  Please decrease the Lantus to 35 units at bedtime. Decrease the Novolog to 5 units 3x a day with your main meals. Increase Metformin as follows: - first 5 days: 500 mg in am and 500 mg with dinner - next 5 days: 1000 mg in am and 500 mg with dinner - thereafter: 1000 mg in am and 1000 mg with dinner  Please return in a month with your log.

## 2012-11-17 NOTE — Progress Notes (Signed)
Subjective:     Patient ID: [name], female   DOB: 1971-09-15, 42 y.o.   MRN: [id number]  HPI Ms. Sagar is a pleasant 42 y/o woman returning for f/u for management of DM2, uncontrolled, insulin-dependent, unknown complications, dx ~1994.  Pt has been on Insulin for 5 years, for the past 6 mo-1 year on increasing doses of Lantus. She has tried Metformin - nausea and bad taste, cannot remember if she was started on the max. dose directly. We stopped Glipizide XL and Actos at last visit. She is now on: - lantus 40 units qhs - Novolog 10 units tid with meals - we restarted Metformin at last visit - increasing the dose slowly. She tells me that she takes Metformin  500 mg with breakfast and 500 mg with lunch. 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 She had few instances in which she felt weak and had to take hard candy while in class.   At last visit, she was not always checking sugars before meals and was eating fast food in last 2 semesters because of her schedule, but now that her schedule changed and she is at home more and checks them. At last visit, we had a long discussion about healthy eating and I recommended a plant-based diet. I discussed ways to start the diet, alternatives to milk, cheese, etc and gave her reference materials - she appeared interested in this.Pt had a radical improvement in her CBGs since her last visit after she started to change her diet, cutting out milk products, process foods, red meat. Her diet is formed mainly of vegetables, fruits, some chicken, which she mentions that she cannot live without.  She now checks sugars 3x a day and brings her log with her. We reviewed it together: - am: previously in the 200s, now between 90-140 with a vast majority being at 100 - before lunch: low sugars of the day, in the last week she has been between 70-146,  with the majority of sugars being in the 80s - before dinner: 100-120, highest 197, lowest 88. She mentions that she started having more lows recently, lower sugar being 71, before lunch. She can feel these and corrected with hard candy or orange juice.  She had 6 pounds of weight loss since last visit, per her scale at home. She had occasional blurry vision in the past but not anymore. She had an eye exam a year ago, reportedly normal. She will schedule an appointment with her ophthalmologist this spring. She denies increased thirst, increased urination or nocturia. No numbness and tingling.   No CKD. No neuropathy. Last HbA1c: Lab Results  Component Value Date   HGBA1C 11.1* 09/23/2012   Lab Results  Component Value Date   MICROALBUR 0.7 02/18/2012  I reviewed pt's medications, allergies, PMH, social hx, family hx and no changes required.  Review of Systems Constitutional: weight loss, + fatigue, no subjective hyperthermia/hypothermia Eyes: no  blurry vision, no xerophthalmia ENT: no sore throat, no nodules palpated in throat, no dysphagia/odynophagia, no hoarseness Cardiovascular: no CP/SOB/palpitations/leg swelling Respiratory: no cough/SOB Gastrointestinal: no N/V/D/C Musculoskeletal: + muscle aches/no joint aches Skin: no rashes Neurological: no tremors/numbness/tingling/dizziness, no more HAs Psychiatric: no depression/anxiety  Objective:   Physical Exam BP 126/84  Pulse 99  Temp(Src) 98 F (36.7 C) (Oral)  Resp 16  Ht 5\' 7"  (1.702 m)  Wt 272 lb (123.378 kg)  BMI 42.59 kg/m2  SpO2 95% Wt Readings from Last 3 Encounters:  11/17/12 272 lb (123.378 kg)  10/17/12 277 lb (125.646 kg)  09/23/12 276 lb (125.193 kg)   Constitutional: obese, in NAD Eyes: PERRLA, EOMI, no exophthalmos ENT: moist mucous membranes, no thyromegaly, no cervical lymphadenopathy Cardiovascular: RRR, No MRG Respiratory: CTA B Gastrointestinal: abdomen soft, NT, ND, BS+ Musculoskeletal: no  deformities, strength intact in all 4 Skin: moist, warm, no rashes Neurological: no tremor with outstretched hands, DTR normal in all 4  Assessment:     1. DM2, uncontrolled, insulin-dependent - no retinopathy, reportedly - no CKD, no MAU  2. Weight gain - started a plant based diet 10/25/2012 - indiscretion: chicken  Plan:     Pt with uncontrolled DM, unknown if complications. - At last visit, we discussed about healthy eating and I recommended a plant-based diet. I discussed ways to start the diet, alternatives to milk, cheese, etc and gave her reference materials for this. She appeared interested in his diet, and she started it with great results in terms of weight loss (expound in one month for her scale ) and also drastic decrease in her blood sugars. She is also doing a great job keeping CBG log in documenting different events that could have caused a higher or a lower blood sugar. - I explained that metformin does not cause her sugars to drop, but the associated insulin can do this - Overall, since her sugars have decreased to almost normal range recently, we will do the following changes in her diabetic regimen:  increase Metformin the target dose of 1000 mg with breakfast and 1000 mg with dinner  decrease the Lantus to 35 units at bedtime.  decrease the Novolog to 5 units 3x a day with main meals. - I congratulated the patient for her success - I will see her back in 1 mo with her log - no labs for today

## 2012-11-22 ENCOUNTER — Other Ambulatory Visit: Payer: Self-pay

## 2012-12-02 ENCOUNTER — Ambulatory Visit (INDEPENDENT_AMBULATORY_CARE_PROVIDER_SITE_OTHER): Payer: PRIVATE HEALTH INSURANCE | Admitting: Internal Medicine

## 2012-12-02 ENCOUNTER — Encounter: Payer: Self-pay | Admitting: Internal Medicine

## 2012-12-02 ENCOUNTER — Encounter: Payer: Self-pay | Admitting: *Deleted

## 2012-12-02 ENCOUNTER — Telehealth: Payer: Self-pay | Admitting: Internal Medicine

## 2012-12-02 VITALS — BP 130/90 | HR 55 | Temp 98.6°F | Wt 269.0 lb

## 2012-12-02 DIAGNOSIS — J019 Acute sinusitis, unspecified: Secondary | ICD-10-CM | POA: Insufficient documentation

## 2012-12-02 MED ORDER — CLINDAMYCIN HCL 300 MG PO CAPS: 300.0000 mg | ORAL_CAPSULE | Freq: Three times a day (TID) | ORAL | Status: DC

## 2012-12-02 NOTE — Assessment & Plan Note (Signed)
May still be viral Continue symptom care If worsens, fill the clinda

## 2012-12-02 NOTE — Progress Notes (Signed)
Subjective:    Patient ID: [name], female    DOB: 02/11/1971, 42 y.o.   MRN: [id number]  HPI Very happy with Dr Wyonia Hough Sugars have been better---really working on proper eating  hjaving "massive sinus crap" Especially on left--neck, throat Started about 5 days ago--- and "doesn't feel well"  No fever Congestion and pressure---just stuffy. Does have some PND Some throat blisters Lots of cough---dry thus far Slight SOB  Tried alka seltzer sinus and zinc lozenges. Motrin for headaches Getting some relief  Current Outpatient Prescriptions on File Prior to Visit  Medication Sig Dispense Refill  . atorvastatin (LIPITOR) 20 MG tablet Take 1 tablet (20 mg total) by mouth daily.  90 tablet  3  . divalproex (DEPAKOTE ER) 500 MG 24 hr tablet Take 3 tablets (1,500 mg total) by mouth at bedtime.  270 tablet  3  . insulin aspart (NOVOLOG FLEXPEN) 100 UNIT/ML injection Inject under skin 10 units 3 x a day  15 pen  4  . insulin glargine (LANTUS SOLOSTAR) 100 UNIT/ML injection Inject 40 Units into the skin daily.  15 pen  3  . Insulin Pen Needle (BD ULTRA-FINE PEN NEEDLES) 29G X 12.7MM MISC Use 3 times daily to inject insulin  300 each  3  . metFORMIN (GLUCOPHAGE) 500 MG tablet Take 2 tablets (1,000 mg total) by mouth 2 (two) times daily with a meal.  130 tablet  0  . rizatriptan (MAXALT) 10 MG tablet Take 10 mg by mouth. 1 at onset of migraine headache        No current facility-administered medications on file prior to visit.    Allergies  Allergen Reactions  . Metformin     REACTION: GI problems  . Penicillins     REACTION: rash  . Prednisone Other (See Comments)    Past Medical History  Diagnosis Date  . Diabetes mellitus     Type II  . DVT (deep venous thrombosis) 2002    right arm and PE Work up negative   . Bipolar disorder   . Eye disorder     inflammatory eye disorder   . Migraines   . Lumbar stenosis     disk disease   . Meningitis 7    with seizure     Past Surgical History  Procedure Laterality Date  . Other surgical history  2008    hysterectomy- Dr. Mia Creek   . Abdominal hysterectomy  2009    Family History  Problem Relation Age of Onset  . Diabetes Mother   . Hypertension Mother   . Cancer Father     lung   . Coronary artery disease Paternal Grandmother     History   Social History  . Marital Status: Married    Spouse Name: N/A    Number of Children: 0  . Years of Education: N/A   Occupational History  . Student--hopefully nursing in fall 2014        .     Social History Main Topics  . Smoking status: Current Every Day Smoker  . Smokeless tobacco: Never Used     Comment: quit but restarted. Plans to stop again with husband and commt lozenges   . Alcohol Use: No     Comment: rare- wine   . Drug Use: No  . Sexually Active: Yes   Other Topics Concern  . Not on file   Social History Narrative         Cares for her mom  who lives with her --Ferman Hamming   Married   No children   Part time student   Some exersize   Review of Systems No nausea, vomiting or diarrhea Appetite is okay Slight left ear drainage     Objective:   Physical Exam  Constitutional: She appears well-developed and well-nourished. No distress.  HENT:  Mouth/Throat: Oropharynx is clear and moist. No oropharyngeal exudate.  Mild frontal tenderness TMs normal Moderate nasal inflammation  Neck: Normal range of motion. Neck supple.  Pulmonary/Chest: Effort normal and breath sounds normal. No respiratory distress. She has no wheezes. She has no rales.  Lymphadenopathy:    She has no cervical adenopathy.          Assessment & Plan:

## 2012-12-02 NOTE — Telephone Encounter (Signed)
Lamar Laundry and Victorino Dike, what should I do about this?  This is really unfortunate as we were making GREAT progress in her DM treatment!

## 2012-12-02 NOTE — Telephone Encounter (Signed)
The patient called to report that she may need to cx her appt on 12/15/12 due to Dr. Elvera Lennox being out of network on her insurance.  Dr. Elvera Lennox is credentialed with all insurances taken by [facility] and I am not sure why [name] is having this issue.  The patient is requesting referral to another endocrinologist that is in network with [employer] if the issue with Dr. Charlean Sanfilippo in network status is not resolved.  Please call the patient at [phone].

## 2012-12-04 ENCOUNTER — Telehealth: Payer: Self-pay | Admitting: Internal Medicine

## 2012-12-04 NOTE — Telephone Encounter (Signed)
error 

## 2012-12-04 NOTE — Telephone Encounter (Signed)
Thank you, Victorino Dike! Let's try to keep her!

## 2012-12-04 NOTE — Telephone Encounter (Signed)
I will e-mail [name] and copy Vicie Mutters (who is out on leave) and see what we need to do to correct the situation.  The patient stated she is very happy with her care here and does not want to change doctors, but cannot afford the out of network fees.  I will copy you on the e-mail as well, Dr. Elvera Lennox. Thanks [employer]

## 2012-12-08 ENCOUNTER — Other Ambulatory Visit: Payer: Self-pay | Admitting: Internal Medicine

## 2012-12-08 MED ORDER — METFORMIN HCL 1000 MG PO TABS: 1000.0000 mg | ORAL_TABLET | Freq: Two times a day (BID) | ORAL | Status: AC

## 2012-12-08 NOTE — Telephone Encounter (Signed)
Will give her the 1000 mg Metformin instead of the 500 mg so she can take 1 tablet bid.

## 2012-12-15 ENCOUNTER — Ambulatory Visit: Payer: PRIVATE HEALTH INSURANCE | Admitting: Internal Medicine

## 2013-04-03 ENCOUNTER — Encounter: Payer: PRIVATE HEALTH INSURANCE | Admitting: Internal Medicine

## 2013-05-09 ENCOUNTER — Inpatient Hospital Stay: Payer: Self-pay | Admitting: Internal Medicine

## 2013-05-09 LAB — CBC WITH DIFFERENTIAL/PLATELET
Eosinophil #: 0.2 10*3/uL (ref 0.0–0.7)
HGB: 15.8 g/dL (ref 12.0–16.0)
Lymphocyte %: 24.3 %
MCV: 90 fL (ref 80–100)
Monocyte %: 6.1 %
Neutrophil %: 66.4 %
WBC: 10.3 10*3/uL (ref 3.6–11.0)

## 2013-05-09 LAB — COMPREHENSIVE METABOLIC PANEL
Alkaline Phosphatase: 125 U/L (ref 50–136)
Bilirubin,Total: 0.4 mg/dL (ref 0.2–1.0)
Co2: 26 mmol/L (ref 21–32)
Creatinine: 0.62 mg/dL (ref 0.60–1.30)
EGFR (Non-African Amer.): >60
Osmolality: 281 (ref 275–301)
Potassium: 4.5 mmol/L (ref 3.5–5.1)
Total Protein: 7.3 g/dL (ref 6.4–8.2)

## 2013-05-09 LAB — MAGNESIUM: Magnesium: 1.8 mg/dL

## 2013-05-09 LAB — TROPONIN I: Troponin-I: <0.02 ng/mL

## 2013-05-10 LAB — CBC WITH DIFFERENTIAL/PLATELET
Basophil #: 0.1 10*3/uL (ref 0.0–0.1)
Basophil %: 0.5 %
Eosinophil #: 0.3 10*3/uL (ref 0.0–0.7)
Eosinophil %: 2.2 %
HGB: 14.4 g/dL (ref 12.0–16.0)
Lymphocyte #: 3.5 10*3/uL (ref 1.0–3.6)
MCH: 30.9 pg (ref 26.0–34.0)
MCHC: 34.4 g/dL (ref 32.0–36.0)
MCV: 90 fL (ref 80–100)
Neutrophil #: 8.8 10*3/uL — ABNORMAL HIGH (ref 1.4–6.5)
Neutrophil %: 65.5 %
Platelet: 233 10*3/uL (ref 150–440)
RBC: 4.65 10*6/uL (ref 3.80–5.20)

## 2013-05-10 LAB — LIPID PANEL
Cholesterol: 136 mg/dL (ref 0–200)
HDL Cholesterol: 23 mg/dL — ABNORMAL LOW (ref 40–60)
Ldl Cholesterol, Calc: 60 mg/dL (ref 0–100)
Triglycerides: 267 mg/dL — ABNORMAL HIGH (ref 0–200)
VLDL Cholesterol, Calc: 53 mg/dL — ABNORMAL HIGH (ref 5–40)

## 2013-05-10 LAB — BASIC METABOLIC PANEL
Anion Gap: 7 (ref 7–16)
Chloride: 103 mmol/L (ref 98–107)
Sodium: 137 mmol/L (ref 136–145)

## 2013-05-20 ENCOUNTER — Ambulatory Visit: Payer: PRIVATE HEALTH INSURANCE | Admitting: Internal Medicine

## 2013-08-13 ENCOUNTER — Other Ambulatory Visit: Payer: Self-pay

## 2013-09-23 ENCOUNTER — Other Ambulatory Visit: Payer: Self-pay | Admitting: Internal Medicine

## 2013-09-23 NOTE — Telephone Encounter (Signed)
Ok to fill? Level last checked 20/13, please advise

## 2013-09-23 NOTE — Telephone Encounter (Signed)
rx sent to pharmacy by e-script   Spoke with patient and her insurance will not cover her seeing Dr. Alphonsus Sias, she has another PCP, I will call and cancel the rx.

## 2013-09-23 NOTE — Telephone Encounter (Signed)
I see her with her mom so I forget about not seeing her You can give 3 month supply x 0 She needs appt for herself within that time

## 2014-07-23 ENCOUNTER — Other Ambulatory Visit: Payer: Self-pay

## 2015-01-28 NOTE — H&P (Signed)
PATIENT NAMValetta [name]:  [name], [name] MR#:  [id number] DATE OF BIRTH:  August 12, 1971  DATE OF ADMISSION:  05/09/2013  REFERRING PHYSICIAN: Malachy Moanevainder Goli, MD   PRIMARY CARE PHYSICIAN: Currently none, before saw Dr. Lahoma RockerPancaldo and Dr. Alphonsus SiasLetvak.   CHIEF COMPLAINT: Shortness of breath.   HISTORY OF PRESENT ILLNESS: The patient is a pleasant 44 year old Caucasian female with a history of diabetes, history of DVT and idiopathic orbital inflammatory disease, bipolar disorder, who is in here for congestion, shortness of breath, cough. She stated that she initially started to have some symptoms about a couple of weeks ago after a trip to [place], where she had nasal congestion, cough and some sore throat. The symptoms persisted. She started to have wheezing about a week ago or so and went to urgent care, where she was given ProAir as well as an antibiotic, which she does not know what. She stated that she has improved somewhat from the cough, but has had significant shortness of breath and wheezing and came into the hospital. She has no significant fever here. Has no significant leukocytosis, but has had significant wheezing in all fields, and hospitalist services were contacted for further evaluation and management. 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 She is not allergic, but has significant intolerance.   PAST MEDICAL HISTORY:  1. History of bronchitis diagnosed a week ago.  2. Diabetes.  3. History of DVT, possibly from IV line, that went to her lungs and then in her heart, per her, requiring surgery.  4. Bipolar disorder.  5. 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  ALLERGIES: PENICILLIN AND STEROID, WHICH IS MORE OF PSYCHOSIS AND NOT AN ALLERGY.   SOCIAL HISTORY: Smokes one-half to a pack a day for 20 years. Occasional alcohol. No drugs. 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   OUTPATIENT MEDICATIONS:  1. Atorvastatin 20  mg daily. 2. Depakote extended release 500 mg 3 caps at night.  3. Ibuprofen p.r.n. 4. Lantus 30 to 40 units at bedtime.  5. Metformin 500 mg 2 times a day. 6. NovoLog 10 to 15 units 3 times a day with meals on sliding scale.  7. ProAir CFC-free 90 mcg 2 puffs every 4 to 6 hours as needed.  8. Promethazine with codeine p.r.n.   FAMILY HISTORY: Father with lung cancer. Mom with hypertension and diabetes.   REVIEW OF SYSTEMS:  CONSTITUTIONAL: No fever, but did wake up multiple times throughout the last couple of weeks at night with feeling wet. No weight changes.  EYES: No blurry vision or double vision. Has history of idiopathic orbital inflammatory disease.  ENT: No tinnitus. No hearing loss. Has seasonal allergies. Has some nasal congestion and occasional sore throat.  RESPIRATORY: Positive for dry cough, wheezing and shortness of breath. No history of COPD or asthma.  CARDIOVASCULAR: The patient does have pleuritic chest pain with cough and deep respirations. No history of high blood pressure, but blood pressure has been high here. No history of CHF. No orthopnea or swelling in the legs.  GASTROINTESTINAL: No nausea, vomiting, diarrhea, abdominal pain, hematemesis, melena or ulcers.  GENITOURINARY: Denies dysuria or hematuria.  HEMATOLOGIC AND LYMPHATIC: No anemia or easy bruising.  SKIN: No rashes.  MUSCULOSKELETAL: Denies arthritis or gout.  NEUROLOGIC: Denies focal weakness or numbness.  PSYCHIATRIC: Has history of bipolar and history of psychosis from steroids before.   PHYSICAL EXAMINATION:  VITAL SIGNS: Temperature 97.8, pulse rate 107,  respiratory rate 20, blood pressure on arrival 176/99, last blood pressure while I was in the room was 207/88, O2 saturation 94% on room air currently, but it was 89% on room air when she came in.  GENERAL: The patient is an obese Caucasian female lying in bed in no obvious distress, a little anxious.  HEENT: Normocephalic, atraumatic. Pupils are  equal and reactive. Anicteric sclerae. Extraocular muscles intact. Moist mucous membranes.  NECK: Supple. No thyroid tenderness. No cervical lymphadenopathy.  CARDIOVASCULAR: S1, S2, tachycardic. No murmurs, rubs or gallops.  LUNGS: There is bilateral diffuse wheezing and coarse breath sounds in both lung fields.  ABDOMEN: Soft, nontender, nondistended. Positive bowel sounds in all quadrants.  EXTREMITIES: No significant lower extremity edema.  NEUROLOGIC: Cranial nerves II through XII grossly intact. Strength is 5 out of 5 in all extremities. Sensation is intact to light touch.  PSYCHIATRIC: Awake, alert, oriented x3.  SKIN: No obvious rashes.  LABORATORY DATA: Glucose 302, BUN 11, creatinine 0.62, sodium 135, potassium 4.5, chloride 102, CO2 26. Magnesium is 1.8. LFTs showed albumin of 3.1, otherwise within normal limits. Troponin negative. WBC is 10.3, hemoglobin 15.8, platelets of 210. EKG: Sinus rhythm with sinus arrhythmia, rate is 99. No acute ST elevations or depressions. There are also some Q waves in lead III.   ASSESSMENT AND PLAN: We have a 44 year old female with history of bipolar, diabetes, history of deep venous thrombosis about 10 years ago or so which traveled to her lungs and possibly her heart, requiring surgery, on no anticoagulation, who presents with shortness of breath, cough, wheezing, without evidence for fevers currently or leukocytosis. X-ray of the chest does not suggest pneumonia. Would admit the patient to the hospital, start her on around-the-clock nebulizers. I believe she has possible acute bronchitis with viral bronchospasm as her initial complaints were more of viral in nature likely. She has been on antibiotic. She cannot tolerate steroids given severe psychosis requiring hospitalization 1 time. I did discuss the need for possible steroid use if she does not get better with nebulizers as she has got significant wheezing. At that time, she was not on any bipolar  medications, and now she is, so perhaps she will not have significant issues as prior, but at this point, given the significant risks associated with them in this patient, I would withhold steroids. I have ordered albuterol and ipratropium every 4 hours while awake. I would treat the cough as well, supply supplemental oxygen and resume Levaquin. Will see what the sputum cultures show, but this could be more viral in nature. It is also possible she has an underlying chronic obstructive pulmonary disease element, and she was instructed to follow with a PCP and obtain pulmonary function tests as an outpatient. She was counseled for 3 minutes in regards to her tobacco abuse, and she stated that she would likely not resume that. I offered a nicotine patch, which she accepted. That will be ordered. In regards to her diabetes, I will continue Lantus as well as a.c. t.i.d. Aspart as well as a sliding scale. Check a hemoglobin A1c and hold the metformin for now. I would continue the Depakote and check a Depakote level. I will start her on deep vein thrombosis prophylaxis.   TOTAL TIME SPENT: 50 minutes.   CODE STATUS: The patient is full code.   ____________________________ Krystal Eaton, MD sa:OSi D: 05/09/2013 15:43:07 ET T: 05/09/2013 16:13:00 ET JOB#: [id number]  cc: Krystal Eaton, MD, <Dictator> Krystal Eaton MD ELECTRONICALLY SIGNED 05/21/2013  13:29 

## 2015-01-28 NOTE — Discharge Summary (Signed)
PATIENT NAMValetta [name]:  [name], Robin MR#:  [id number] DATE OF BIRTH:  October 07, 1971  DATE OF ADMISSION:  05/09/2013 DATE OF DISCHARGE:    ADMITTING DIAGNOSIS: Shortness of breath.   DISCHARGE DIAGNOSES:  1.  Shortness of breath, possibly due to a combination of acute bronchitis as well as possible new onset chronic obstructive pulmonary disease. The patient's symptoms now have improved.  2.  Diabetes with very poor control.  3.  History of deep vein thrombosis in the past with complaint of swelling in the left arm, which was negative for deep vein thrombosis.  4.  Bipolar disorder.  5.  History of idiopathic orbital inflammatory disease.   CONSULTANTS: None.   PERTINENT LABS AND EVALUATIONS: Admitting EKG showed normal sinus rhythm with sinus arrhythmia. Troponin was less than 0.02. Magnesium was 1.8. BMP: Glucose was 302, BUN 11, creatinine 0.62, sodium 135, potassium 4.5, chloride 102, CO2 26, calcium 8.6. LFTs showed albumin of 3.1. Blood cultures x 2 no growth. Chest x-ray showed no acute abnormality. Total cholesterol 136, triglycerides 267, HDL 23, LDL was 60. Hemoglobin A1c 10.3, Doppler of the left upper extremity was negative.   HOSPITAL COURSE: Please refer to H and P done by the admitting physician. The patient is a 44 year old pleasant Caucasian female with a history of diabetes, history of DVT, idiopathic orbital inflammatory disease, bipolar disorder, who presented with complaint of shortness of breath, cough, wheezing and congestion. The patient has no diagnosis of COPD, but was tried on Pro-Air and antibiotics, but did not improve. The patient was seen in the ED and we were asked to admit the patient for acute respiratory failure. She was started on nebulizers and antibiotics. Unfortunately, the patient stated that she was not able to take steroids due to having psychosis with her bipolar disorder. The patient was continued on nebulizer treatments and antibiotics with resolution of her  shortness of breath. Her cough is still present, but is much improved. She is currently stable for discharge. She was also interested in stopping smoking, so a nicotine patch is prescribed for her at discharge.   DISCHARGE MEDICATIONS: Depakote 500 mg 3 tabs at bedtime, ibuprofen 200 mg 2 to 3 tabs q.4 to q.6 hours p.r.n. for pain, Pro-Air 2 puffs q.4 to 6 hours p.r.n. for shortness of breath, promethazine with codeine 10 mg/6.25, 5 mL q.6 hour p.r.n., metformin 500 mg 1 tab p.o. b.i.d., atorvastatin 20 mg at bedtime, Lantus 50 units at bedtime, NovoLog Flex pen 10 units 3 times a day prior to each meal, albuterol and Atrovent nebulizers 3 mL 4 times a day as needed for wheezing, guaifenesin 600 mg 1 tab p.o. b.i.d., nicotine 21 mg patch once a day, Levaquin 500 mg 1 tab p.o. q.24 hours x 4 days.   DIET: Low sodium, low fat, low cholesterol, carbohydrate diet.   ACTIVITY: As tolerated.   FOLLOWUP: With primary M.D. in 1 to 2 weeks. Follow up with pulmonary, Dr. Meredeth IdeFleming or Dr. Welton FlakesKhan in 4 to 6 weeks. The patient told to stop smoking.    TIME SPENT: 35 minutes spent on the discharge.   ____________________________ Lacie ScottsShreyang H. Allena KatzPatel, MD shp:aw D: 05/12/2013 13:08:17 ET T: 05/12/2013 14:48:05 ET JOB#: [id number]  cc: Chaunce Winkels H. Allena KatzPatel, MD, <Dictator> Charise CarwinSHREYANG H Danny Yackley MD ELECTRONICALLY SIGNED 05/18/2013 10:26

## 2015-07-31 ENCOUNTER — Emergency Department
Admission: EM | Admit: 2015-07-31 | Discharge: 2015-07-31 | Disposition: A | Discharge status: Home / Self Care | Payer: BLUE CROSS/BLUE SHIELD | Attending: Emergency Medicine | Admitting: Emergency Medicine

## 2015-07-31 ENCOUNTER — Encounter: Payer: Self-pay | Admitting: Emergency Medicine

## 2015-07-31 ENCOUNTER — Emergency Department: Payer: BLUE CROSS/BLUE SHIELD

## 2015-07-31 DIAGNOSIS — Z79899 Other long term (current) drug therapy: Secondary | ICD-10-CM | POA: Insufficient documentation

## 2015-07-31 DIAGNOSIS — Z720 Tobacco use: Secondary | ICD-10-CM | POA: Insufficient documentation

## 2015-07-31 DIAGNOSIS — H578 Other specified disorders of eye and adnexa: Secondary | ICD-10-CM | POA: Diagnosis present

## 2015-07-31 DIAGNOSIS — Z880 Allergy status to penicillin: Secondary | ICD-10-CM | POA: Insufficient documentation

## 2015-07-31 DIAGNOSIS — Z794 Long term (current) use of insulin: Secondary | ICD-10-CM | POA: Diagnosis not present

## 2015-07-31 DIAGNOSIS — I10 Essential (primary) hypertension: Secondary | ICD-10-CM | POA: Diagnosis not present

## 2015-07-31 DIAGNOSIS — H1131 Conjunctival hemorrhage, right eye: Secondary | ICD-10-CM | POA: Diagnosis not present

## 2015-07-31 DIAGNOSIS — Z792 Long term (current) use of antibiotics: Secondary | ICD-10-CM | POA: Insufficient documentation

## 2015-07-31 DIAGNOSIS — E119 Type 2 diabetes mellitus without complications: Secondary | ICD-10-CM | POA: Diagnosis not present

## 2015-07-31 DIAGNOSIS — J9801 Acute bronchospasm: Secondary | ICD-10-CM | POA: Diagnosis not present

## 2015-07-31 DIAGNOSIS — G8929 Other chronic pain: Secondary | ICD-10-CM | POA: Diagnosis not present

## 2015-07-31 LAB — BASIC METABOLIC PANEL
ANION GAP: 10 (ref 5–15)
BUN: 15 mg/dL (ref 6–20)
CO2: 23 mmol/L (ref 22–32)
Calcium: 9.2 mg/dL (ref 8.9–10.3)
Chloride: 102 mmol/L (ref 101–111)
Creatinine, Ser: 0.77 mg/dL (ref 0.44–1.00)
Glucose, Bld: 224 mg/dL — ABNORMAL HIGH (ref 65–99)
POTASSIUM: 4.3 mmol/L (ref 3.5–5.1)
SODIUM: 135 mmol/L (ref 135–145)

## 2015-07-31 LAB — CBC
HEMATOCRIT: 49.1 % — AB (ref 35.0–47.0)
HEMOGLOBIN: 16.1 g/dL — AB (ref 12.0–16.0)
MCH: 30.1 pg (ref 26.0–34.0)
MCHC: 32.8 g/dL (ref 32.0–36.0)
MCV: 91.7 fL (ref 80.0–100.0)
Platelets: 255 10*3/uL (ref 150–440)
RBC: 5.36 MIL/uL — AB (ref 3.80–5.20)
RDW: 12.9 % (ref 11.5–14.5)
WBC: 13.7 10*3/uL — AB (ref 3.6–11.0)

## 2015-07-31 LAB — TROPONIN I: Troponin I: <0.03 ng/mL (ref <0.031)

## 2015-07-31 MED ORDER — ALBUTEROL SULFATE HFA 108 (90 BASE) MCG/ACT IN AERS: 2.0000 | INHALATION_SPRAY | RESPIRATORY_TRACT | Status: DC | PRN

## 2015-07-31 NOTE — ED Provider Notes (Signed)
[facility] Emergency Department Provider Note  ____________________________________________  Time seen: 4:45 PM  I have reviewed the triage vital signs and the nursing notes.   HISTORY  Chief Complaint Chest Pain and Eye Problem    HPI [name] is a 44 y.o. female who presents with 2 complaints. The first is that earlier today she noted some blood on the outside of her right eye. She denies any vision changes or eye pain. She does have some chronic eye pain but nothing out of the ordinary. No headache fever or chills. No neck pain. No hypertension.  Her other complaint is that she's been having some intermittent chest pain today. It is described as a tightness or pressure that lasts for one or 2 minutes, she feels it in the right side of the chest and around to the back and is worse with breathing. No travel, trauma, hospitalizations or surgeries.She does has a history of provoked DVT but no recent extremity symptoms. She uses a steroid inhaler for asthma and reports previously using albuterol which did help quite a bit. Denies any cough presently.    Past Medical History  Diagnosis Date  . Diabetes mellitus     Type II  . DVT (deep venous thrombosis) (HCC) 2002    right arm and PE Work up negative   . Bipolar disorder (HCC)   . Eye disorder     inflammatory eye disorder   . Migraines   . Lumbar stenosis     disk disease   . Meningitis 7    with seizure     Patient Active Problem List   Diagnosis Date Noted  . Acute sinusitis 12/02/2012  . Type II or unspecified type diabetes mellitus without mention of complication, uncontrolled 09/23/2012  . Routine general medical examination at a health care facility 02/18/2012  . TOBACCO USER 08/03/2010  . HYPERTENSION 03/24/2010  . IRIDOCYCLITIS, RECURRENT 02/16/2010  . HYPERLIPIDEMIA 07/06/2009  . BIPOLAR AFFECTIVE DISORDER 07/06/2009  . SPINAL STENOSIS, LUMBAR 07/06/2009     Past Surgical  History  Procedure Laterality Date  . Other surgical history  2008    hysterectomy- Dr. Mia Creek   . Abdominal hysterectomy  2009     Current Outpatient Rx  Name  Route  Sig  Dispense  Refill  . albuterol (PROVENTIL HFA) 108 (90 BASE) MCG/ACT inhaler   Inhalation   Inhale 2 puffs into the lungs every 4 (four) hours as needed for wheezing or shortness of breath.   1 Inhaler   0   . atorvastatin (LIPITOR) 20 MG tablet   Oral   Take 1 tablet (20 mg total) by mouth daily.   90 tablet   3   . Cholecalciferol (VITAMIN D-3) 1000 UNITS CAPS   Oral   Take 1 capsule by mouth daily.         . clindamycin (CLEOCIN) 300 MG capsule   Oral   Take 1 capsule (300 mg total) by mouth 3 (three) times daily.   30 capsule   0   . DEPAKOTE ER 500 MG 24 hr tablet      TAKE 3 TABLETS BY MOUTH AT BEDTIME   270 tablet   3     Dispense as written.   . insulin aspart (NOVOLOG FLEXPEN) 100 UNIT/ML injection      Inject under skin 10 units 3 x a day   15 pen   4   . insulin glargine (LANTUS SOLOSTAR) 100 UNIT/ML injection  Subcutaneous   Inject 40 Units into the skin daily.   15 pen   3   . Insulin Pen Needle (BD ULTRA-FINE PEN NEEDLES) 29G X 12.7MM MISC      Use 3 times daily to inject insulin   300 each   3   . metFORMIN (GLUCOPHAGE) 1000 MG tablet   Oral   Take 1 tablet (1,000 mg total) by mouth 2 (two) times daily with a meal.   180 tablet   3   . rizatriptan (MAXALT) 10 MG tablet   Oral   Take 10 mg by mouth. 1 at onset of migraine headache          . vitamin B-12 (CYANOCOBALAMIN) 1000 MCG tablet   Oral   Take 1,000 mcg by mouth daily.            Allergies Penicillins and Prednisone   Family History  Problem Relation Age of Onset  . Diabetes Mother   . Hypertension Mother   . Cancer Father     lung   . Coronary artery disease Paternal Grandmother     Social History Social History  Substance Use Topics  . Smoking status: Current Every Day Smoker   . Smokeless tobacco: Never Used     Comment: quit but restarted. Plans to stop again with husband and commt lozenges   . Alcohol Use: No     Comment: rare- wine     Review of Systems  Constitutional:   No fever or chills. No weight changes Eyes:   No blurry vision or double vision. Blood on the surface of the eye as above ENT:   No sore throat. Cardiovascular:   Positive as above chest pain. Respiratory:   No dyspnea or cough. Gastrointestinal:   Negative for abdominal pain, vomiting and diarrhea.  No BRBPR or melena. Genitourinary:   Negative for dysuria, urinary retention, bloody urine, or difficulty urinating. Musculoskeletal:   Negative for back pain. No joint swelling or pain. Skin:   Negative for rash. Neurological:   Negative for headaches, focal weakness or numbness. Psychiatric:  No anxiety or depression.   Endocrine:  No hot/cold intolerance, changes in energy, or sleep difficulty.  10-point ROS otherwise negative.  ____________________________________________   PHYSICAL EXAM:  VITAL SIGNS: ED Triage Vitals  Enc Vitals Group     BP 07/31/15 1613 143/82 mmHg     Pulse Rate 07/31/15 1613 106     Resp 07/31/15 1613 18     Temp 07/31/15 1613 98.5 F (36.9 C)     Temp Source 07/31/15 1613 Oral     SpO2 07/31/15 1613 98 %     Weight 07/31/15 1613 230 lb (104.327 kg)     Height 07/31/15 1613 5\' 8"  (1.727 m)     Head Cir --      Peak Flow --      Pain Score 07/31/15 1610 5     Pain Loc --      Pain Edu? --      Excl. in GC? --      Constitutional:   Alert and oriented. Well appearing and in no distress. Eyes:   No scleral icterus. No conjunctival pallor. PERRL. EOMI. There is a mild right lateral subconjunctival hemorrhage that does not involve the limbus. No evidence of trauma or lid injury, no foreign body. ENT   Head:   Normocephalic and atraumatic.   Nose:   No congestion/rhinnorhea. No septal hematoma   Mouth/Throat:  MMM, no pharyngeal  erythema. No peritonsillar mass. No uvula shift.   Neck:   No stridor. No SubQ emphysema. No meningismus. Hematological/Lymphatic/Immunilogical:   No cervical lymphadenopathy. Cardiovascular:   RRR, heart rate 95 at rest. Normal and symmetric distal pulses are present in all extremities. No murmurs, rubs, or gallops. Respiratory:   Normal respiratory effort without tachypnea nor retractions. Diffuse expiratory wheezing. No focal rales or rhonchi.  Gastrointestinal:   Soft and nontender. No distention. There is no CVA tenderness.  No rebound, rigidity, or guarding. Genitourinary:   deferred Musculoskeletal:   Nontender with normal range of motion in all extremities. No joint effusions.  No lower extremity tenderness.  No edema. Neurologic:   Normal speech and language.  CN 2-10 normal. Motor grossly intact. No pronator drift.  Normal gait. No gross focal neurologic deficits are appreciated.  Skin:    Skin is warm, dry and intact. No rash noted.  No petechiae, purpura, or bullae. Psychiatric:   Mood and affect are normal. Speech and behavior are normal. Patient exhibits appropriate insight and judgment.  ____________________________________________    LABS (pertinent positives/negatives) (all labs ordered are listed, but only abnormal results are displayed) Labs Reviewed  BASIC METABOLIC PANEL - Abnormal; Notable for the following:    Glucose, Bld 224 (*)    All other components within normal limits  CBC - Abnormal; Notable for the following:    WBC 13.7 (*)    RBC 5.36 (*)    Hemoglobin 16.1 (*)    HCT 49.1 (*)    All other components within normal limits  TROPONIN I   ____________________________________________   EKG  Interpreted by me Sinus tachycardia rate 113, normal axis intervals QRS and ST segments and T waves  ____________________________________________    RADIOLOGY  Chest x-ray  unremarkable  ____________________________________________   PROCEDURES   ____________________________________________   INITIAL IMPRESSION / ASSESSMENT AND PLAN / ED COURSE  Pertinent labs & imaging results that were available during my care of the patient were reviewed by me and considered in my medical decision making (see chart for details).  Patient presents with subconjunctival hemorrhage which is non-concerning. So complains of some chest pain which appears to be due to bronchospasm. Labs are reassuring and do show that she is probably a little bit hemoconcentrated this well. I have low suspicion for PE at this time despite her history of DVT as she does look very comfortable and the pain is been intermittent and is not present currently, which is very atypical for PE. Low suspicion for ACS TAD pneumothorax carditis mediastinitis pneumonia or sepsis. I'll prescribe her albuterol and have her follow up with primary care this week     ____________________________________________   FINAL CLINICAL IMPRESSION(S) / ED DIAGNOSES  Final diagnoses:  Subconjunctival hematoma, right  Bronchospasm, acute      Sharman Cheek, MD 07/31/15 1710

## 2015-07-31 NOTE — ED Notes (Signed)
Pt states sudden onset of chest pain this morning that resolved on it's own then came back intermittently. Pt states worsening chest pain now as well as blood in her eye. Blood noted to the cornea of her R eye. Denies vision changes at this time.

## 2015-07-31 NOTE — Discharge Instructions (Signed)
Bronchospasm, Adult A bronchospasm is a spasm or tightening of the airways going into the lungs. During a bronchospasm breathing becomes more difficult because the airways get smaller. When this happens there can be coughing, a whistling sound when breathing (wheezing), and difficulty breathing. Bronchospasm is often associated with asthma, but not all patients who experience a bronchospasm have asthma. CAUSES  A bronchospasm is caused by inflammation or irritation of the airways. The inflammation or irritation may be triggered by:   Allergies (such as to animals, pollen, food, or mold). Allergens that cause bronchospasm may cause wheezing immediately after exposure or many hours later.   Infection. Viral infections are believed to be the most common cause of bronchospasm.   Exercise.   Irritants (such as pollution, cigarette smoke, strong odors, aerosol sprays, and paint fumes).   Weather changes. Winds increase molds and pollens in the air. Rain refreshes the air by washing irritants out. Cold air may cause inflammation.   Stress and emotional upset.  SIGNS AND SYMPTOMS   Wheezing.   Excessive nighttime coughing.   Frequent or severe coughing with a simple cold.   Chest tightness.   Shortness of breath.  DIAGNOSIS  Bronchospasm is usually diagnosed through a history and physical exam. Tests, such as chest X-rays, are sometimes done to look for other conditions. TREATMENT   Inhaled medicines can be given to open up your airways and help you breathe. The medicines can be given using either an inhaler or a nebulizer machine.  Corticosteroid medicines may be given for severe bronchospasm, usually when it is associated with asthma. HOME CARE INSTRUCTIONS   Always have a plan prepared for seeking medical care. Know when to call your health care provider and local emergency services (911 in the [place] Know where you can access local emergency care.  Only take medicines as  directed by your health care provider.  If you were prescribed an inhaler or nebulizer machine, ask your health care provider to explain how to use it correctly. Always use a spacer with your inhaler if you were given one.  It is necessary to remain calm during an attack. Try to relax and breathe more slowly.  Control your home environment in the following ways:   Change your heating and air conditioning filter at least once a month.   Limit your use of fireplaces and wood stoves.  Do not smoke and do not allow smoking in your home.   Avoid exposure to perfumes and fragrances.   Get rid of pests (such as roaches and mice) and their droppings.   Throw away plants if you see mold on them.   Keep your house clean and dust free.   Replace carpet with wood, tile, or vinyl flooring. Carpet can trap dander and dust.   Use allergy-proof pillows, mattress covers, and box spring covers.   Wash bed sheets and blankets every week in hot water and dry them in a dryer.   Use blankets that are made of polyester or cotton.   Wash hands frequently. SEEK MEDICAL CARE IF:   You have muscle aches.   You have chest pain.   The sputum changes from clear or white to yellow, green, gray, or bloody.   The sputum you cough up gets thicker.   There are problems that may be related to the medicine you are given, such as a rash, itching, swelling, or trouble breathing.  SEEK IMMEDIATE MEDICAL CARE IF:   You have worsening wheezing and coughing  even after taking your prescribed medicines.   You have increased difficulty breathing.   You develop severe chest pain. MAKE SURE YOU:   Understand these instructions.  Will watch your condition.  Will get help right away if you are not doing well or get worse.   This information is not intended to replace advice given to you by your health care provider. Make sure you discuss any questions you have with your health care  provider.   Document Released: 09/27/2003 Document Revised: 10/15/2014 Document Reviewed: 03/16/2013 Elsevier Interactive Patient Education 2016 Elsevier Inc.  Subconjunctival Hemorrhage Subconjunctival hemorrhage is bleeding that happens between the white part of your eye (sclera) and the clear membrane that covers the outside of your eye (conjunctiva). There are many tiny blood vessels near the surface of your eye. A subconjunctival hemorrhage happens when one or more of these vessels breaks and bleeds, causing a red patch to appear on your eye. This is similar to a bruise. Depending on the amount of bleeding, the red patch may only cover a small area of your eye or it may cover the entire visible part of the sclera. If a lot of blood collects under the conjunctiva, there may also be swelling. Subconjunctival hemorrhages do not affect your vision or cause pain, but your eye may feel irritated if there is swelling. Subconjunctival hemorrhages usually do not require treatment, and they disappear on their own within two weeks. CAUSES This condition may be caused by:  Mild trauma, such as rubbing your eye too hard.  Severe trauma or blunt injuries.  Coughing, sneezing, or vomiting.  Straining, such as when lifting a heavy object.  High blood pressure.  Recent eye surgery.  A history of diabetes.  Certain medicines, especially blood thinners (anticoagulants).  Other conditions, such as eye tumors, bleeding disorders, or blood vessel abnormalities. Subconjunctival hemorrhages can happen without an obvious cause.  SYMPTOMS  Symptoms of this condition include:  A bright red or dark red patch on the white part of the eye.  The red area may spread out to cover a larger area of the eye before it goes away.  The red area may turn brownish-yellow before it goes away.  Swelling.  Mild eye irritation. DIAGNOSIS This condition is diagnosed with a physical exam. If your subconjunctival  hemorrhage was caused by trauma, your health care provider may refer you to an eye specialist (ophthalmologist) or another specialist to check for other injuries. You may have other tests, including:  An eye exam.  A blood pressure check.  Blood tests to check for bleeding disorders. If your subconjunctival hemorrhage was caused by trauma, X-rays or a CT scan may be done to check for other injuries. TREATMENT Usually, no treatment is needed. Your health care provider may recommend eye drops or cold compresses to help with discomfort. HOME CARE INSTRUCTIONS  Take over-the-counter and prescription medicines only as directed by your health care provider.  Use eye drops or cold compresses to help with discomfort as directed by your health care provider.  Avoid activities, things, and environments that may irritate or injure your eye.  Keep all follow-up visits as told by your health care provider. This is important. SEEK MEDICAL CARE IF:  You have pain in your eye.  The bleeding does not go away within 3 weeks.  You keep getting new subconjunctival hemorrhages. SEEK IMMEDIATE MEDICAL CARE IF:  Your vision changes or you have difficulty seeing.  You suddenly develop severe sensitivity to light.  You develop a severe headache, persistent vomiting, confusion, or abnormal tiredness (lethargy).  Your eye seems to bulge or protrude from your eye socket.  You develop unexplained bruises on your body.  You have unexplained bleeding in another area of your body.   This information is not intended to replace advice given to you by your health care provider. Make sure you discuss any questions you have with your health care provider.   Document Released: 09/24/2005 Document Revised: 06/15/2015 Document Reviewed: 12/01/2014 Elsevier Interactive Patient Education [employer]2016 Elsevier Inc.

## 2015-09-05 ENCOUNTER — Other Ambulatory Visit: Payer: Self-pay | Admitting: Family Medicine

## 2015-09-05 DIAGNOSIS — Z1239 Encounter for other screening for malignant neoplasm of breast: Secondary | ICD-10-CM

## 2015-09-16 ENCOUNTER — Ambulatory Visit: Payer: BLUE CROSS/BLUE SHIELD

## 2015-09-23 ENCOUNTER — Ambulatory Visit
Admission: RE | Admit: 2015-09-23 | Discharge: 2015-09-23 | Disposition: A | Discharge status: Home / Self Care | Payer: BLUE CROSS/BLUE SHIELD | Source: Ambulatory Visit | Attending: Family Medicine | Admitting: Family Medicine

## 2015-09-23 DIAGNOSIS — Z1239 Encounter for other screening for malignant neoplasm of breast: Secondary | ICD-10-CM

## 2015-09-23 DIAGNOSIS — Z1231 Encounter for screening mammogram for malignant neoplasm of breast: Secondary | ICD-10-CM | POA: Diagnosis not present

## 2015-10-06 ENCOUNTER — Other Ambulatory Visit: Payer: Self-pay | Admitting: Family Medicine

## 2015-10-06 DIAGNOSIS — R928 Other abnormal and inconclusive findings on diagnostic imaging of breast: Secondary | ICD-10-CM

## 2015-10-11 ENCOUNTER — Encounter: Payer: Self-pay | Admitting: Oncology

## 2015-10-11 ENCOUNTER — Inpatient Hospital Stay: Payer: BLUE CROSS/BLUE SHIELD | Attending: Oncology | Admitting: Oncology

## 2015-10-11 ENCOUNTER — Inpatient Hospital Stay: Payer: BLUE CROSS/BLUE SHIELD

## 2015-10-11 VITALS — BP 141/84 | HR 90 | Temp 96.7°F | Resp 16 | Wt 240.5 lb

## 2015-10-11 DIAGNOSIS — Z90710 Acquired absence of both cervix and uterus: Secondary | ICD-10-CM | POA: Diagnosis not present

## 2015-10-11 DIAGNOSIS — Z86718 Personal history of other venous thrombosis and embolism: Secondary | ICD-10-CM | POA: Diagnosis not present

## 2015-10-11 DIAGNOSIS — Z803 Family history of malignant neoplasm of breast: Secondary | ICD-10-CM | POA: Diagnosis not present

## 2015-10-11 DIAGNOSIS — Z79899 Other long term (current) drug therapy: Secondary | ICD-10-CM | POA: Insufficient documentation

## 2015-10-11 DIAGNOSIS — F17210 Nicotine dependence, cigarettes, uncomplicated: Secondary | ICD-10-CM | POA: Insufficient documentation

## 2015-10-11 DIAGNOSIS — D72829 Elevated white blood cell count, unspecified: Secondary | ICD-10-CM

## 2015-10-11 DIAGNOSIS — F319 Bipolar disorder, unspecified: Secondary | ICD-10-CM | POA: Diagnosis not present

## 2015-10-11 DIAGNOSIS — D751 Secondary polycythemia: Secondary | ICD-10-CM | POA: Diagnosis not present

## 2015-10-11 DIAGNOSIS — M4806 Spinal stenosis, lumbar region: Secondary | ICD-10-CM | POA: Insufficient documentation

## 2015-10-11 DIAGNOSIS — Z801 Family history of malignant neoplasm of trachea, bronchus and lung: Secondary | ICD-10-CM | POA: Diagnosis not present

## 2015-10-11 DIAGNOSIS — Z7984 Long term (current) use of oral hypoglycemic drugs: Secondary | ICD-10-CM | POA: Diagnosis not present

## 2015-10-11 DIAGNOSIS — E119 Type 2 diabetes mellitus without complications: Secondary | ICD-10-CM

## 2015-10-11 DIAGNOSIS — Z794 Long term (current) use of insulin: Secondary | ICD-10-CM | POA: Diagnosis not present

## 2015-10-11 DIAGNOSIS — F419 Anxiety disorder, unspecified: Secondary | ICD-10-CM | POA: Diagnosis not present

## 2015-10-11 LAB — CBC WITH DIFFERENTIAL/PLATELET
BASOS ABS: 0.1 10*3/uL (ref 0–0.1)
Basophils Relative: 1 %
Eosinophils Absolute: 0.2 10*3/uL (ref 0–0.7)
Eosinophils Relative: 2 %
HCT: 48.9 % — ABNORMAL HIGH (ref 35.0–47.0)
HEMOGLOBIN: 16.2 g/dL — AB (ref 12.0–16.0)
LYMPHS ABS: 3.7 10*3/uL — AB (ref 1.0–3.6)
LYMPHS PCT: 32 %
MCH: 30.2 pg (ref 26.0–34.0)
MCHC: 33.2 g/dL (ref 32.0–36.0)
MCV: 90.9 fL (ref 80.0–100.0)
Monocytes Absolute: 0.5 10*3/uL (ref 0.2–0.9)
Monocytes Relative: 4 %
NEUTROS ABS: 7 10*3/uL — AB (ref 1.4–6.5)
NEUTROS PCT: 61 %
Platelets: 269 10*3/uL (ref 150–440)
RBC: 5.38 MIL/uL — AB (ref 3.80–5.20)
RDW: 13.2 % (ref 11.5–14.5)
WBC: 11.6 10*3/uL — AB (ref 3.6–11.0)

## 2015-10-11 LAB — LACTATE DEHYDROGENASE: LDH: 129 U/L (ref 98–192)

## 2015-10-11 NOTE — Progress Notes (Signed)
[facility]  Telephone:([phone] Fax:(336) [phone]  ID: ABRIE EGLOFF OB: 08-May-1971  MR#: [id number]  AOZ#:[id number]  Patient Care Team: Rayetta Humphrey, MD as PCP - General (Family Medicine)  CHIEF COMPLAINT:  Chief Complaint  Patient presents with  . New Evaluation    Leukocytosis    INTERVAL HISTORY: Patient is a 45 year old female who was found to have a persistently elevated white blood cell count over the past year. She also had a recent mammogram that was reported as BI-RADS 0. Currently, she is anxious but otherwise feels well. She has no neurologic complaints. She denies any recent fevers or illnesses. She has no new medications. She has a good appetite and denies weight loss. She denies any chest pain or shortness of breath. She denies any nausea, vomiting, constipation, or diarrhea. She has no urinary complaints. Patient otherwise feels well and offers no further specific complaints.  REVIEW OF SYSTEMS:   Review of Systems  Constitutional: Negative for fever, weight loss and malaise/fatigue.  Respiratory: Negative.   Cardiovascular: Negative.   Gastrointestinal: Negative.   Musculoskeletal: Negative.   Neurological: Negative for weakness.  Psychiatric/Behavioral: The patient is nervous/anxious.     As per HPI. Otherwise, a complete review of systems is negatve.  PAST MEDICAL HISTORY: Past Medical History  Diagnosis Date  . Diabetes mellitus     Type II  . DVT (deep venous thrombosis) ([facility]) 2002    right arm and PE Work up negative   . Bipolar disorder ([facility])   . Eye disorder     inflammatory eye disorder   . Migraines   . Lumbar stenosis     disk disease   . Meningitis 7    with seizure    PAST SURGICAL HISTORY: Past Surgical History  Procedure Laterality Date  . Other surgical history  2008    hysterectomy- Dr. Mia Creek   . Abdominal hysterectomy  2009    FAMILY HISTORY Family History  Problem Relation Age of Onset  .  Diabetes Mother   . Hypertension Mother   . Cancer Father     lung   . Coronary artery disease Paternal Grandmother   . Breast cancer Neg Hx        ADVANCED DIRECTIVES:    HEALTH MAINTENANCE: Social History  Substance Use Topics  . Smoking status: Current Every Day Smoker  . Smokeless tobacco: Never Used     Comment: quit but restarted. Plans to stop again with husband and commt lozenges   . Alcohol Use: No     Comment: rare- wine      Colonoscopy:  PAP:  Bone density:  Lipid panel:  Allergies  Allergen Reactions  . Penicillins     REACTION: rash  . Prednisone Other (See Comments)    Current Outpatient Prescriptions  Medication Sig Dispense Refill  . albuterol (PROVENTIL HFA) 108 (90 BASE) MCG/ACT inhaler Inhale 2 puffs into the lungs every 4 (four) hours as needed for wheezing or shortness of breath. 1 Inhaler 0  . atorvastatin (LIPITOR) 20 MG tablet Take 1 tablet (20 mg total) by mouth daily. 90 tablet 3  . Cholecalciferol (VITAMIN D-3) 1000 UNITS CAPS Take 1 capsule by mouth daily.    Marland Kitchen DEPAKOTE ER 500 MG 24 hr tablet TAKE 3 TABLETS BY MOUTH AT BEDTIME 270 tablet 3  . HUMALOG KWIKPEN 100 UNIT/ML KiwkPen INJECT 20 UNITS SUBCUTANEOUSLY THREE TIMES DAILY WITH MEALS. WITH SSI  12  . insulin glargine (LANTUS SOLOSTAR) 100  UNIT/ML injection Inject 40 Units into the skin daily. 15 pen 3  . Insulin Pen Needle (BD ULTRA-FINE PEN NEEDLES) 29G X 12.7MM MISC Use 3 times daily to inject insulin 300 each 3  . metFORMIN (GLUCOPHAGE) 1000 MG tablet Take 1 tablet (1,000 mg total) by mouth 2 (two) times daily with a meal. 180 tablet 3  . rizatriptan (MAXALT) 10 MG tablet Take 10 mg by mouth. 1 at onset of migraine headache     . vitamin B-12 (CYANOCOBALAMIN) 1000 MCG tablet Take 1,000 mcg by mouth daily.    . clindamycin (CLEOCIN) 300 MG capsule Take 1 capsule (300 mg total) by mouth 3 (three) times daily. (Patient not taking: Reported on 10/11/2015) 30 capsule 0   No current  facility-administered medications for this visit.    OBJECTIVE: Filed Vitals:   10/11/15 1546  BP: 141/84  Pulse: 90  Temp: 96.7 F (35.9 C)  Resp: 16     Body mass index is 36.58 kg/(m^2).    ECOG FS:0 - Asymptomatic  General: Well-developed, well-nourished, no acute distress. Eyes: Pink conjunctiva, anicteric sclera. HEENT: Normocephalic, moist mucous membranes, clear oropharnyx. Lungs: Clear to auscultation bilaterally. Heart: Regular rate and rhythm. No rubs, murmurs, or gallops. Abdomen: Soft, nontender, nondistended. No organomegaly noted, normoactive bowel sounds. Musculoskeletal: No edema, cyanosis, or clubbing. Neuro: Alert, answering all questions appropriately. Cranial nerves grossly intact. Skin: No rashes or petechiae noted. Psych: Normal affect. Lymphatics: No cervical, calvicular, axillary or inguinal LAD.   LAB RESULTS:  Lab Results  Component Value Date   NA 135 07/31/2015   K 4.3 07/31/2015   CL 102 07/31/2015   CO2 23 07/31/2015   GLUCOSE 224* 07/31/2015   BUN 15 07/31/2015   CREATININE 0.77 07/31/2015   CALCIUM 9.2 07/31/2015   PROT 7.3 05/09/2013   ALBUMIN 3.1* 05/09/2013   AST 24 05/09/2013   ALT 38 05/09/2013   ALKPHOS 125 05/09/2013   BILITOT 0.4 05/09/2013   GFRNONAA >60 07/31/2015   GFRAA >60 07/31/2015    Lab Results  Component Value Date   WBC 11.6* 10/11/2015   NEUTROABS 7.0* 10/11/2015   HGB 16.2* 10/11/2015   HCT 48.9* 10/11/2015   MCV 90.9 10/11/2015   PLT 269 10/11/2015     STUDIES: Mm Digital Screening Bilateral  09/23/2015  CLINICAL DATA:  Screening. EXAM: DIGITAL SCREENING BILATERAL MAMMOGRAM WITH CAD COMPARISON:  Previous exam(s). ACR Breast Density Category b: There are scattered areas of fibroglandular density. FINDINGS: In the left breast, possible distortion warrants further evaluation. In the right breast, no findings suspicious for malignancy. Images were processed with CAD. IMPRESSION: Further evaluation is  suggested for possible distortion in the left breast. RECOMMENDATION: Diagnostic mammogram and possibly ultrasound of the left breast. (Code:[id number]-70M) The patient will be contacted regarding the findings, and additional imaging will be scheduled. BI-RADS CATEGORY  0: Incomplete. Need additional imaging evaluation and/or prior mammograms for comparison. Electronically Signed   By: Hulan Saashomas  Lawrence M.D.   On: 09/23/2015 17:32    ASSESSMENT: Leukocytosis, polycythemia.  PLAN:    1. Leukocytosis: Patient's white blood cell count is mildly elevated 11.6 today which is relatively unchanged over the past year. Will order peripheral blood flow cytometry as well as BCR-ABL for completeness. Patient will return to clinic in 3 months with repeat laboratory work and further evaluation. If her laboratory work returns positive, we will schedule this appointment sooner. 2. Polycythemia: Mild. Continue to monitor and consider further workup in the future. 3. Screening mammogram: Patient  was noted to have a mammogram BI-RADS 0. Additional imaging has been ordered and is pending.  Patient expressed understanding and was in agreement with this plan. She also understands that She can call clinic at any time with any questions, concerns, or complaints.   No matching staging information was found for the patient.  Jeralyn Ruths, MD   10/11/2015 5:02 PM

## 2015-10-18 ENCOUNTER — Ambulatory Visit
Admission: RE | Admit: 2015-10-18 | Discharge: 2015-10-18 | Disposition: A | Discharge status: Home / Self Care | Payer: BLUE CROSS/BLUE SHIELD | Source: Ambulatory Visit | Attending: Family Medicine | Admitting: Family Medicine

## 2015-10-18 DIAGNOSIS — R928 Other abnormal and inconclusive findings on diagnostic imaging of breast: Secondary | ICD-10-CM

## 2015-10-18 LAB — COMP PANEL: LEUKEMIA/LYMPHOMA

## 2015-10-18 LAB — BCR-ABL1, CML/ALL, PCR, QUANT

## 2016-01-09 ENCOUNTER — Inpatient Hospital Stay: Payer: BLUE CROSS/BLUE SHIELD

## 2016-01-09 ENCOUNTER — Inpatient Hospital Stay: Payer: BLUE CROSS/BLUE SHIELD | Admitting: Oncology

## 2016-01-26 ENCOUNTER — Other Ambulatory Visit: Payer: BLUE CROSS/BLUE SHIELD

## 2016-01-26 ENCOUNTER — Ambulatory Visit: Payer: BLUE CROSS/BLUE SHIELD | Admitting: Oncology

## 2016-01-30 ENCOUNTER — Inpatient Hospital Stay (HOSPITAL_BASED_OUTPATIENT_CLINIC_OR_DEPARTMENT_OTHER): Payer: BLUE CROSS/BLUE SHIELD | Admitting: Oncology

## 2016-01-30 ENCOUNTER — Inpatient Hospital Stay: Payer: BLUE CROSS/BLUE SHIELD | Attending: Oncology

## 2016-01-30 VITALS — BP 137/83 | HR 94 | Temp 98.3°F | Resp 16 | Wt 244.0 lb

## 2016-01-30 DIAGNOSIS — Z801 Family history of malignant neoplasm of trachea, bronchus and lung: Secondary | ICD-10-CM | POA: Diagnosis not present

## 2016-01-30 DIAGNOSIS — Z794 Long term (current) use of insulin: Secondary | ICD-10-CM | POA: Diagnosis not present

## 2016-01-30 DIAGNOSIS — E119 Type 2 diabetes mellitus without complications: Secondary | ICD-10-CM | POA: Diagnosis not present

## 2016-01-30 DIAGNOSIS — F319 Bipolar disorder, unspecified: Secondary | ICD-10-CM | POA: Diagnosis not present

## 2016-01-30 DIAGNOSIS — Z7984 Long term (current) use of oral hypoglycemic drugs: Secondary | ICD-10-CM

## 2016-01-30 DIAGNOSIS — D72829 Elevated white blood cell count, unspecified: Secondary | ICD-10-CM

## 2016-01-30 DIAGNOSIS — M4806 Spinal stenosis, lumbar region: Secondary | ICD-10-CM

## 2016-01-30 DIAGNOSIS — Z79899 Other long term (current) drug therapy: Secondary | ICD-10-CM

## 2016-01-30 DIAGNOSIS — Z86718 Personal history of other venous thrombosis and embolism: Secondary | ICD-10-CM | POA: Diagnosis not present

## 2016-01-30 DIAGNOSIS — Z8661 Personal history of infections of the central nervous system: Secondary | ICD-10-CM | POA: Diagnosis not present

## 2016-01-30 DIAGNOSIS — D751 Secondary polycythemia: Secondary | ICD-10-CM

## 2016-01-30 DIAGNOSIS — F17210 Nicotine dependence, cigarettes, uncomplicated: Secondary | ICD-10-CM

## 2016-01-30 LAB — CBC WITH DIFFERENTIAL/PLATELET
BASOS ABS: 0.1 10*3/uL (ref 0–0.1)
BASOS PCT: 1 %
EOS ABS: 0.2 10*3/uL (ref 0–0.7)
EOS PCT: 2 %
HCT: 48.1 % — ABNORMAL HIGH (ref 35.0–47.0)
Hemoglobin: 16.2 g/dL — ABNORMAL HIGH (ref 12.0–16.0)
Lymphocytes Relative: 27 %
Lymphs Abs: 3.5 10*3/uL (ref 1.0–3.6)
MCH: 30.3 pg (ref 26.0–34.0)
MCHC: 33.7 g/dL (ref 32.0–36.0)
MCV: 90.1 fL (ref 80.0–100.0)
Monocytes Absolute: 0.5 10*3/uL (ref 0.2–0.9)
Monocytes Relative: 4 %
Neutro Abs: 8.5 10*3/uL — ABNORMAL HIGH (ref 1.4–6.5)
Neutrophils Relative %: 66 %
PLATELETS: 240 10*3/uL (ref 150–440)
RBC: 5.34 MIL/uL — AB (ref 3.80–5.20)
RDW: 13.3 % (ref 11.5–14.5)
WBC: 12.8 10*3/uL — AB (ref 3.6–11.0)

## 2016-01-30 NOTE — Progress Notes (Signed)
Patient does not offer any problems today.  

## 2016-01-30 NOTE — Progress Notes (Signed)
[place] [facility]  Telephone:(336) [phone] Fax:(336) [phone]  ID: [name] OB: 10-01-1971  MR#: [id number]  KZS#:[id number]  Patient Care Team: Sharyne Peach, MD as PCP - General (Family Medicine)  CHIEF COMPLAINT:  Chief Complaint  Patient presents with  . leukocytosis    INTERVAL HISTORY: Patient Returns to clinic today for repeat laboratory work and further evaluation. She continues to feel well and remains asymptomatic. She has no neurologic complaints. She denies any recent fevers or illnesses. She has a good appetite and denies weight loss. She denies any chest pain or shortness of breath. She denies any nausea, vomiting, constipation, or diarrhea. She has no urinary complaints. Patient offers no specific complaints today.  REVIEW OF SYSTEMS:   Review of Systems  Constitutional: Negative for fever, weight loss and malaise/fatigue.  Respiratory: Negative.   Cardiovascular: Negative.   Gastrointestinal: Negative.   Genitourinary: Negative.   Musculoskeletal: Negative.   Neurological: Negative for weakness.  Psychiatric/Behavioral: The patient is not nervous/anxious.     As per HPI. Otherwise, a complete review of systems is negatve.  PAST MEDICAL HISTORY: Past Medical History  Diagnosis Date  . Diabetes mellitus     Type II  . DVT (deep venous thrombosis) ([facility]) 2002    right arm and PE Work up negative   . Bipolar disorder ([facility])   . Eye disorder     inflammatory eye disorder   . Migraines   . Lumbar stenosis     disk disease   . Meningitis 7    with seizure    PAST SURGICAL HISTORY: Past Surgical History  Procedure Laterality Date  . Other surgical history  2008    hysterectomy- Dr. Burke Keels   . Abdominal hysterectomy  2009    FAMILY HISTORY Family History  Problem Relation Age of Onset  . Diabetes Mother   . Hypertension Mother   . Cancer Father     lung   . Coronary artery disease Paternal Grandmother   . Breast cancer Neg  Hx        ADVANCED DIRECTIVES:    HEALTH MAINTENANCE: Social History  Substance Use Topics  . Smoking status: Current Every Day Smoker  . Smokeless tobacco: Never Used     Comment: quit but restarted. Plans to stop again with husband and commt lozenges   . Alcohol Use: No     Comment: rare- wine      Colonoscopy:  PAP:  Bone density:  Lipid panel:  Allergies  Allergen Reactions  . Penicillins     REACTION: rash  . Prednisone Other (See Comments)    Current Outpatient Prescriptions  Medication Sig Dispense Refill  . albuterol (PROVENTIL HFA) 108 (90 BASE) MCG/ACT inhaler Inhale 2 puffs into the lungs every 4 (four) hours as needed for wheezing or shortness of breath. 1 Inhaler 0  . atorvastatin (LIPITOR) 20 MG tablet Take 1 tablet (20 mg total) by mouth daily. 90 tablet 3  . Cholecalciferol (VITAMIN D-3) 1000 UNITS CAPS Take 1 capsule by mouth daily.    . clindamycin (CLEOCIN) 300 MG capsule Take 1 capsule (300 mg total) by mouth 3 (three) times daily. 30 capsule 0  . DEPAKOTE ER 500 MG 24 hr tablet TAKE 3 TABLETS BY MOUTH AT BEDTIME 270 tablet 3  . HUMALOG KWIKPEN 100 UNIT/ML KiwkPen INJECT 20 UNITS SUBCUTANEOUSLY THREE TIMES DAILY WITH MEALS. WITH SSI  12  . insulin glargine (LANTUS SOLOSTAR) 100 UNIT/ML injection Inject 40 Units into the skin  daily. 15 pen 3  . Insulin Pen Needle (BD ULTRA-FINE PEN NEEDLES) 29G X 12.7MM MISC Use 3 times daily to inject insulin 300 each 3  . metFORMIN (GLUCOPHAGE) 1000 MG tablet Take 1 tablet (1,000 mg total) by mouth 2 (two) times daily with a meal. 180 tablet 3  . rizatriptan (MAXALT) 10 MG tablet Take 10 mg by mouth. 1 at onset of migraine headache     . vitamin B-12 (CYANOCOBALAMIN) 1000 MCG tablet Take 1,000 mcg by mouth daily.     No current facility-administered medications for this visit.    OBJECTIVE: Filed Vitals:   01/30/16 1507  BP: 137/83  Pulse: 94  Temp: 98.3 F (36.8 C)  Resp: 16     Body mass index is 37.12  kg/(m^2).    ECOG FS:0 - Asymptomatic  General: Well-developed, well-nourished, no acute distress. Eyes: Pink conjunctiva, anicteric sclera. Lungs: Clear to auscultation bilaterally. Heart: Regular rate and rhythm. No rubs, murmurs, or gallops. Abdomen: Soft, nontender, nondistended. No organomegaly noted, normoactive bowel sounds. Musculoskeletal: No edema, cyanosis, or clubbing. Neuro: Alert, answering all questions appropriately. Cranial nerves grossly intact. Skin: No rashes or petechiae noted. Psych: Normal affect.   LAB RESULTS:  Lab Results  Component Value Date   NA 135 07/31/2015   K 4.3 07/31/2015   CL 102 07/31/2015   CO2 23 07/31/2015   GLUCOSE 224* 07/31/2015   BUN 15 07/31/2015   CREATININE 0.77 07/31/2015   CALCIUM 9.2 07/31/2015   PROT 7.3 05/09/2013   ALBUMIN 3.1* 05/09/2013   AST 24 05/09/2013   ALT 38 05/09/2013   ALKPHOS 125 05/09/2013   BILITOT 0.4 05/09/2013   GFRNONAA >60 07/31/2015   GFRAA >60 07/31/2015    Lab Results  Component Value Date   WBC 12.8* 01/30/2016   NEUTROABS 8.5* 01/30/2016   HGB 16.2* 01/30/2016   HCT 48.1* 01/30/2016   MCV 90.1 01/30/2016   PLT 240 01/30/2016     STUDIES: No results found.  ASSESSMENT: Leukocytosis, polycythemia.  PLAN:    1. Leukocytosis: Patient's white blood cell count continues to be mildly elevated, but unchanged over at least 2 years. Peripheral blood flow cytometry as well as BCR-ABL are negative. The remainder of her laboratory workup is also either negative or within normal limits. No intervention is needed at this time. Patient does not require bone marrow biopsy.  Return to clinic in 6 months with repeat laboratory work and further evaluation. If everything remains stable at that time, she likely can be discharged from clinic. 2. Polycythemia: Mild. Continue to monitor and consider further workup in the future. 3. Screening mammogram:  repeat imaging revealed BI-RADS 3. Patient will require a  mammogram in 6 months which she has been instructed to schedule 1-2 days prior to next appointment so we can discuss results.   Patient expressed understanding and was in agreement with this plan. She also understands that She can call clinic at any time with any questions, concerns, or complaints.    Lloyd Huger, MD   01/30/2016 3:29 PM

## 2016-03-23 ENCOUNTER — Other Ambulatory Visit: Payer: Self-pay | Admitting: Family Medicine

## 2016-03-23 DIAGNOSIS — R928 Other abnormal and inconclusive findings on diagnostic imaging of breast: Secondary | ICD-10-CM

## 2016-04-23 ENCOUNTER — Ambulatory Visit
Admission: RE | Admit: 2016-04-23 | Discharge: 2016-04-23 | Disposition: A | Discharge status: Home / Self Care | Payer: BLUE CROSS/BLUE SHIELD | Source: Ambulatory Visit | Attending: Family Medicine | Admitting: Family Medicine

## 2016-04-23 DIAGNOSIS — R928 Other abnormal and inconclusive findings on diagnostic imaging of breast: Secondary | ICD-10-CM | POA: Diagnosis present

## 2016-08-02 ENCOUNTER — Inpatient Hospital Stay: Payer: BLUE CROSS/BLUE SHIELD | Admitting: Oncology

## 2016-08-02 ENCOUNTER — Inpatient Hospital Stay: Payer: BLUE CROSS/BLUE SHIELD

## 2016-11-01 ENCOUNTER — Other Ambulatory Visit: Payer: Self-pay | Admitting: Family Medicine

## 2016-11-01 DIAGNOSIS — R928 Other abnormal and inconclusive findings on diagnostic imaging of breast: Secondary | ICD-10-CM

## 2016-11-15 ENCOUNTER — Other Ambulatory Visit: Payer: BLUE CROSS/BLUE SHIELD

## 2016-11-15 ENCOUNTER — Ambulatory Visit: Payer: BLUE CROSS/BLUE SHIELD

## 2016-12-03 ENCOUNTER — Other Ambulatory Visit: Payer: BLUE CROSS/BLUE SHIELD

## 2016-12-17 ENCOUNTER — Ambulatory Visit: Payer: BLUE CROSS/BLUE SHIELD

## 2016-12-17 ENCOUNTER — Other Ambulatory Visit: Payer: BLUE CROSS/BLUE SHIELD

## 2016-12-28 ENCOUNTER — Other Ambulatory Visit: Payer: BLUE CROSS/BLUE SHIELD

## 2017-01-10 ENCOUNTER — Ambulatory Visit
Admission: RE | Admit: 2017-01-10 | Discharge: 2017-01-10 | Disposition: A | Discharge status: Home / Self Care | Payer: BLUE CROSS/BLUE SHIELD | Source: Ambulatory Visit | Attending: Family Medicine | Admitting: Family Medicine

## 2017-01-10 DIAGNOSIS — R928 Other abnormal and inconclusive findings on diagnostic imaging of breast: Secondary | ICD-10-CM

## 2017-05-20 ENCOUNTER — Telehealth: Payer: Self-pay | Admitting: Oncology

## 2017-05-28 DIAGNOSIS — D72829 Elevated white blood cell count, unspecified: Secondary | ICD-10-CM | POA: Insufficient documentation

## 2017-05-28 NOTE — Progress Notes (Addendum)
[place]  [phone] [phone]) [phone]  ID: JENEAL VOGL OB: Dec 07, 1970  MR#: [id number]  BUL#:[id number]  Patient Care Team: Sharyne Peach, MD as PCP - General (Family Medicine)  CHIEF COMPLAINT: Leukocytosis, unspecified.  INTERVAL HISTORY: Patient returns to clinic today after being referred from PCP for elevated wbc and elevated hemoglobin. She was last evaluated in clinic in April 2017. Patient reports continuing to feel well and remains asymptomatic. She endorses that she was anxious about today's visit but denies neurologic complaints. She denies any recent fevers or illnesses. She has a good appetite and denies weight loss. She denies any chest pain or shortness of breath. She denies any nausea, vomiting, constipation, or diarrhea. She has no urinary complaints. Patient offers no specific complaints today. Patient reports that she was lost to follow-up due to chronic illness and ultimately the passing of her mother. Patient continues to smoke cigarettes and says that she has tried to quit multiple times without success.  REVIEW OF SYSTEMS:   Review of Systems  Constitutional: Negative for fever, malaise/fatigue and weight loss.  Respiratory: Negative.  Negative for cough and shortness of breath.   Cardiovascular: Negative.  Negative for chest pain and leg swelling.  Gastrointestinal: Negative.  Negative for abdominal pain and blood in stool.  Genitourinary: Negative.  Negative for dysuria.  Musculoskeletal: Negative.   Skin: Negative.  Negative for rash.  Neurological: Negative.  Negative for sensory change and weakness.  Psychiatric/Behavioral: The patient is nervous/anxious.     As per HPI. Otherwise, a complete review of systems is negative.  PAST MEDICAL HISTORY: Past Medical History:  Diagnosis Date  . Bipolar disorder ([place])   . Diabetes mellitus    Type II  . DVT (deep venous thrombosis) ([place]) 2002   right arm and PE Work up  negative   . Eye disorder    inflammatory eye disorder   . Lumbar stenosis    disk disease   . Meningitis 7   with seizure  . Migraines     PAST SURGICAL HISTORY: Past Surgical History:  Procedure Laterality Date  . ABDOMINAL HYSTERECTOMY  2009  . OTHER SURGICAL HISTORY  2008   hysterectomy- Dr. Burke Keels     FAMILY HISTORY Family History  Problem Relation Age of Onset  . Diabetes Mother   . Hypertension Mother   . Cancer Father        lung   . Coronary artery disease Paternal Grandmother   . Breast cancer Neg Hx        ADVANCED DIRECTIVES:    HEALTH MAINTENANCE: Social History  Substance Use Topics  . Smoking status: Current Every Day Smoker  . Smokeless tobacco: Never Used     Comment: quit but restarted. Plans to stop again with husband and commt lozenges   . Alcohol use No     Comment: rare- wine      Colonoscopy:  PAP:  Bone density:  Lipid panel:  Allergies  Allergen Reactions  . Penicillins     REACTION: rash  . Prednisone Other (See Comments)    Current Outpatient Prescriptions  Medication Sig Dispense Refill  . albuterol (PROVENTIL HFA) 108 (90 BASE) MCG/ACT inhaler Inhale 2 puffs into the lungs every 4 (four) hours as needed for wheezing or shortness of breath. 1 Inhaler 0  . albuterol (PROVENTIL) (2.5 MG/3ML) 0.083% nebulizer solution Inhale into the lungs.    Marland Kitchen atorvastatin (LIPITOR) 20 MG tablet Take 1 tablet (20 mg total) by  mouth daily. 90 tablet 3  . DEPAKOTE ER 500 MG 24 hr tablet TAKE 3 TABLETS BY MOUTH AT BEDTIME 270 tablet 3  . ibuprofen (ADVIL,MOTRIN) 200 MG tablet Take by mouth.    . Insulin Glargine (LANTUS SOLOSTAR) 100 UNIT/ML Solostar Pen Inject into the skin.    Marland Kitchen insulin lispro (HUMALOG) 100 UNIT/ML KiwkPen Inject 18 Units subcutaneously with breakfast, 18 units sq with lunch, and 20 units sq with dinner.. With SSI.    Marland Kitchen Insulin Pen Needle (BD ULTRA-FINE PEN NEEDLES) 29G X 12.7MM MISC Use 3 times daily to inject insulin 300  each 3  . metFORMIN (GLUCOPHAGE) 1000 MG tablet Take 1 tablet (1,000 mg total) by mouth 2 (two) times daily with a meal. 180 tablet 3  . rizatriptan (MAXALT) 10 MG tablet Take 10 mg by mouth. 1 at onset of migraine headache     . traZODone (DESYREL) 50 MG tablet Take by mouth.    . Cholecalciferol (VITAMIN D-3) 1000 UNITS CAPS Take 1 capsule by mouth daily.    . clindamycin (CLEOCIN) 300 MG capsule Take 1 capsule (300 mg total) by mouth 3 (three) times daily. (Patient not taking: Reported on 05/30/2017) 30 capsule 0  . vitamin B-12 (CYANOCOBALAMIN) 1000 MCG tablet Take 1,000 mcg by mouth daily.     No current facility-administered medications for this visit.     OBJECTIVE: Vitals:   05/30/17 1510  BP: (!) 171/103  Pulse: 88  Resp: 20  Temp: (!) 96 F (35.6 C)     Body mass index is 37.95 kg/m.    ECOG FS:0 - Asymptomatic  General: Well-developed, well-nourished, no acute distress. Eyes: Pink conjunctiva, anicteric sclera. Lungs: Clear to auscultation bilaterally. Heart: Regular rate and rhythm. No rubs, murmurs, or gallops. Abdomen: Soft, nontender, nondistended. No organomegaly noted, normoactive bowel sounds. Musculoskeletal: No edema, cyanosis, or clubbing. Neuro: Alert, answering all questions appropriately. Cranial nerves grossly intact. Skin: No rashes or petechiae noted. Psych: Normal affect.   LAB RESULTS:  Lab Results  Component Value Date   NA 135 07/31/2015   K 4.3 07/31/2015   CL 102 07/31/2015   CO2 23 07/31/2015   GLUCOSE 224 (H) 07/31/2015   BUN 15 07/31/2015   CREATININE 0.77 07/31/2015   CALCIUM 9.2 07/31/2015   PROT 7.3 05/09/2013   ALBUMIN 3.1 (L) 05/09/2013   AST 24 05/09/2013   ALT 38 05/09/2013   ALKPHOS 125 05/09/2013   BILITOT 0.4 05/09/2013   GFRNONAA >60 07/31/2015   GFRAA >60 07/31/2015    Lab Results  Component Value Date   WBC 13.7 (H) 05/30/2017   NEUTROABS 8.5 (H) 01/30/2016   HGB 14.9 05/30/2017   HCT 42.9 05/30/2017   MCV  90.2 05/30/2017   PLT 241 05/30/2017     STUDIES: No results found.  ASSESSMENT: Leukocytosis, unspecified.  PLAN:    1. Leukocytosis, unspecified: Reviewed lab results from previous visits and through [facility]. WBC count 14.9 on 05/15/17. Previous lab studies including BCR-ABL which was negative, normal flow cytometry. While patient's white blood cell count continues to be elevated it appears to have been relatively unchanged in the past 2 years. Patient does not require a bone marrow biopsy at this time. We'll plan for her to have labs drawn today and return in 3 months for CBC and further evaluation. 2. Polycythemia: Mild. Will do labs today and discuss at next visit.  3. Screening mammogram 01/10/17: BI-RADS CATEGORY 1. Recommendation to repeat in one year. Okay for PCP  to manage. 4. Elevated blood pressure: systolic & diastolic elevated. Patient admitting to being anxious about today's visit.Will recheck blood pressure and re-evaluate at future visits.   Patient expressed understanding and was in agreement with this plan. She also understands that She can call clinic at any time with any questions, concerns, or complaints.   Approximately 30 minutes was spent in discussion of which greater than 50% was consultation.  Babacar Haycraft G. Zenia Resides, NP 05/30/17 3:57 PM  Patient was seen and evaluated independently and I agree with the assessment and plan as dictated above. Patient's blood carbon monoxide level is elevated which may be contributing to her chronic leukocytosis as well as her history of polycythemia. Hemoglobin currently is within normal limits. JAK-2 mutation and hemochromatosis panel are pending at time of dictation, otherwise the remainder of her laboratory work is either negative or within normal limits.  Lloyd Huger, MD 06/01/17 8:28 AM

## 2017-05-30 ENCOUNTER — Inpatient Hospital Stay: Payer: BLUE CROSS/BLUE SHIELD

## 2017-05-30 ENCOUNTER — Telehealth: Payer: Self-pay

## 2017-05-30 ENCOUNTER — Inpatient Hospital Stay: Payer: BLUE CROSS/BLUE SHIELD | Attending: Oncology | Admitting: Oncology

## 2017-05-30 VITALS — BP 171/103 | HR 88 | Temp 96.0°F | Resp 20 | Wt 249.6 lb

## 2017-05-30 DIAGNOSIS — D751 Secondary polycythemia: Secondary | ICD-10-CM | POA: Diagnosis not present

## 2017-05-30 DIAGNOSIS — Z8661 Personal history of infections of the central nervous system: Secondary | ICD-10-CM | POA: Diagnosis not present

## 2017-05-30 DIAGNOSIS — Z79899 Other long term (current) drug therapy: Secondary | ICD-10-CM

## 2017-05-30 DIAGNOSIS — R030 Elevated blood-pressure reading, without diagnosis of hypertension: Secondary | ICD-10-CM | POA: Diagnosis not present

## 2017-05-30 DIAGNOSIS — F419 Anxiety disorder, unspecified: Secondary | ICD-10-CM | POA: Diagnosis not present

## 2017-05-30 DIAGNOSIS — F17210 Nicotine dependence, cigarettes, uncomplicated: Secondary | ICD-10-CM

## 2017-05-30 DIAGNOSIS — D72829 Elevated white blood cell count, unspecified: Secondary | ICD-10-CM | POA: Diagnosis not present

## 2017-05-30 DIAGNOSIS — Z880 Allergy status to penicillin: Secondary | ICD-10-CM | POA: Diagnosis not present

## 2017-05-30 DIAGNOSIS — F319 Bipolar disorder, unspecified: Secondary | ICD-10-CM | POA: Diagnosis not present

## 2017-05-30 DIAGNOSIS — Z794 Long term (current) use of insulin: Secondary | ICD-10-CM | POA: Diagnosis not present

## 2017-05-30 DIAGNOSIS — M48061 Spinal stenosis, lumbar region without neurogenic claudication: Secondary | ICD-10-CM

## 2017-05-30 DIAGNOSIS — E119 Type 2 diabetes mellitus without complications: Secondary | ICD-10-CM

## 2017-05-30 LAB — CBC
HEMATOCRIT: 42.9 % (ref 35.0–47.0)
HEMOGLOBIN: 14.9 g/dL (ref 12.0–16.0)
MCH: 31.2 pg (ref 26.0–34.0)
MCHC: 34.6 g/dL (ref 32.0–36.0)
MCV: 90.2 fL (ref 80.0–100.0)
Platelets: 241 10*3/uL (ref 150–440)
RBC: 4.76 MIL/uL (ref 3.80–5.20)
RDW: 13.2 % (ref 11.5–14.5)
WBC: 13.7 10*3/uL — ABNORMAL HIGH (ref 3.6–11.0)

## 2017-05-30 LAB — IRON AND TIBC
Iron: 62 ug/dL (ref 28–170)
Saturation Ratios: 20 % (ref 10.4–31.8)
TIBC: 314 ug/dL (ref 250–450)
UIBC: 252 ug/dL

## 2017-05-30 LAB — FERRITIN: Ferritin: 120 ng/mL (ref 11–307)

## 2017-05-30 NOTE — Telephone Encounter (Signed)
-----   Message from Collene Leyden sent at 05/30/2017 10:22 AM EDT ----- Regarding: RE: Move up Left pt message to come in at 3:00pm. ----- Message ----- From: [place] Cellar, CMA Sent: 05/30/2017  10:07 AM To: Collene Leyden Subject: Move up                                        Can we please see if patient can move up from 3:30 to 3:00? Irving Copas has a meeting at [employer] today. Thanks!!

## 2017-05-30 NOTE — Progress Notes (Signed)
Patient denies any concerns today.  

## 2017-05-31 LAB — CARBON MONOXIDE, BLOOD (PERFORMED AT REF LAB): CARBON MONOXIDE, BLOOD: 7.9 % — AB (ref 0.0–3.6)

## 2017-05-31 LAB — ERYTHROPOIETIN: Erythropoietin: 5.2 m[IU]/mL (ref 2.6–18.5)

## 2017-06-05 LAB — COMP PANEL: LEUKEMIA/LYMPHOMA

## 2017-06-05 LAB — HEMOCHROMATOSIS DNA-PCR(C282Y,H63D)

## 2017-09-02 ENCOUNTER — Inpatient Hospital Stay: Payer: BLUE CROSS/BLUE SHIELD

## 2017-09-02 ENCOUNTER — Inpatient Hospital Stay: Payer: BLUE CROSS/BLUE SHIELD | Admitting: Oncology

## 2018-02-21 ENCOUNTER — Other Ambulatory Visit: Payer: Self-pay | Admitting: Family Medicine

## 2018-02-21 DIAGNOSIS — Z1231 Encounter for screening mammogram for malignant neoplasm of breast: Secondary | ICD-10-CM

## 2018-05-16 ENCOUNTER — Ambulatory Visit
Admission: RE | Admit: 2018-05-16 | Discharge: 2018-05-16 | Disposition: A | Discharge status: Home / Self Care | Payer: BLUE CROSS/BLUE SHIELD | Source: Ambulatory Visit | Attending: Family Medicine | Admitting: Family Medicine

## 2018-05-16 DIAGNOSIS — Z1231 Encounter for screening mammogram for malignant neoplasm of breast: Secondary | ICD-10-CM | POA: Insufficient documentation

## 2020-04-06 ENCOUNTER — Other Ambulatory Visit: Payer: Self-pay | Admitting: Family Medicine

## 2020-04-06 DIAGNOSIS — Z1231 Encounter for screening mammogram for malignant neoplasm of breast: Secondary | ICD-10-CM

## 2020-04-22 ENCOUNTER — Ambulatory Visit
Admission: RE | Admit: 2020-04-22 | Discharge: 2020-04-22 | Disposition: A | Discharge status: Home / Self Care | Payer: BC Managed Care – PPO | Source: Ambulatory Visit | Attending: Family Medicine | Admitting: Family Medicine

## 2020-04-22 DIAGNOSIS — Z1231 Encounter for screening mammogram for malignant neoplasm of breast: Secondary | ICD-10-CM | POA: Diagnosis not present

## 2020-05-11 LAB — EXTERNAL GENERIC LAB PROCEDURE: COLOGUARD: NEGATIVE

## 2021-04-26 ENCOUNTER — Other Ambulatory Visit: Payer: Self-pay | Admitting: Family Medicine

## 2021-04-26 DIAGNOSIS — Z1231 Encounter for screening mammogram for malignant neoplasm of breast: Secondary | ICD-10-CM

## 2021-06-27 ENCOUNTER — Ambulatory Visit
Admission: RE | Admit: 2021-06-27 | Discharge: 2021-06-27 | Disposition: A | Discharge status: Home / Self Care | Payer: 59 | Source: Ambulatory Visit | Attending: Family Medicine | Admitting: Family Medicine

## 2021-06-27 ENCOUNTER — Other Ambulatory Visit: Payer: Self-pay

## 2021-06-27 DIAGNOSIS — Z1231 Encounter for screening mammogram for malignant neoplasm of breast: Secondary | ICD-10-CM | POA: Diagnosis present

## 2021-07-13 ENCOUNTER — Ambulatory Visit
Admission: EM | Admit: 2021-07-13 | Discharge: 2021-07-13 | Disposition: A | Discharge status: Home / Self Care | Payer: 59 | Attending: Internal Medicine | Admitting: Internal Medicine

## 2021-07-13 ENCOUNTER — Other Ambulatory Visit: Payer: Self-pay

## 2021-07-13 ENCOUNTER — Encounter: Payer: Self-pay | Admitting: Emergency Medicine

## 2021-07-13 DIAGNOSIS — J040 Acute laryngitis: Secondary | ICD-10-CM | POA: Diagnosis not present

## 2021-07-13 DIAGNOSIS — R051 Acute cough: Secondary | ICD-10-CM | POA: Diagnosis not present

## 2021-07-13 DIAGNOSIS — J3089 Other allergic rhinitis: Secondary | ICD-10-CM

## 2021-07-13 MED ORDER — ALBUTEROL SULFATE HFA 108 (90 BASE) MCG/ACT IN AERS: 2.0000 | INHALATION_SPRAY | RESPIRATORY_TRACT | 0 refills | Status: AC | PRN

## 2021-07-13 MED ORDER — ALBUTEROL SULFATE HFA 108 (90 BASE) MCG/ACT IN AERS
2.0000 | INHALATION_SPRAY | RESPIRATORY_TRACT | 0 refills | Status: DC | PRN
Start: 2021-07-13 — End: 2021-07-13

## 2021-07-13 MED ORDER — BENZONATATE 200 MG PO CAPS: 200.0000 mg | ORAL_CAPSULE | Freq: Two times a day (BID) | ORAL | 0 refills | Status: AC | PRN

## 2021-07-13 MED ORDER — FEXOFENADINE HCL 180 MG PO TABS: 180.0000 mg | ORAL_TABLET | Freq: Every day | ORAL | 0 refills | Status: AC

## 2021-07-13 NOTE — ED Triage Notes (Signed)
Pt presents today with c/o of nasal congestion, sore throat and cough that began yesterday. Denies fever. Home Covid test negative. Pt does not want to be tested for Covid today.

## 2021-07-13 NOTE — ED Provider Notes (Addendum)
[facility]    CSN: [id number] Arrival date & time: 07/13/21  1044      History   Chief Complaint Chief Complaint  Patient presents with   Nasal Congestion   Cough    HPI [name] is a 50 y.o. female who presents due to having post nasal draniage cough, and was very horsed yesterday and could not work since she is on the phone for work. She rested her voice, gargled with salt water and thought she was better this am, but is still a little horsed, though better than yesterday. Tends to have similar symptoms every Fall with some wheezing as well. Has not had a fever, body aches. Has not taken any meds. Needs a note for work.     Past Medical History:  Diagnosis Date   Bipolar disorder ([facility])    Diabetes mellitus    Type II   DVT (deep venous thrombosis) ([facility]) 2002   right arm and PE Work up negative    Eye disorder    inflammatory eye disorder    Lumbar stenosis    disk disease    Meningitis 7   with seizure   Migraines     Patient Active Problem List   Diagnosis Date Noted   Leukocytosis 05/28/2017   Acute sinusitis 12/02/2012   Type II or unspecified type diabetes mellitus without mention of complication, uncontrolled 09/23/2012   Routine general medical examination at a health care facility 02/18/2012   TOBACCO USER 08/03/2010   HYPERTENSION 03/24/2010   IRIDOCYCLITIS, RECURRENT 02/16/2010   HYPERLIPIDEMIA 07/06/2009   BIPOLAR AFFECTIVE DISORDER 07/06/2009   SPINAL STENOSIS, LUMBAR 07/06/2009    Past Surgical History:  Procedure Laterality Date   ABDOMINAL HYSTERECTOMY  2009   OTHER SURGICAL HISTORY  2008   hysterectomy- Dr. Mia Creek     OB History   No obstetric history on file.      Home Medications    Prior to Admission medications   Medication Sig Start Date End Date Taking? Authorizing Provider  benzonatate (TESSALON) 200 MG capsule Take 1 capsule (200 mg total) by mouth 2 (two) times daily as needed for cough. 07/13/21  Yes  Rodriguez-Southworth, Nettie Elm, PA-C  fexofenadine (ALLEGRA) 180 MG tablet Take 1 tablet (180 mg total) by mouth daily. 07/13/21  Yes Rodriguez-Southworth, Nettie Elm, PA-C  albuterol (PROVENTIL HFA) 108 (90 Base) MCG/ACT inhaler Inhale 2 puffs into the lungs every 4 (four) hours as needed for wheezing or shortness of breath. Prn cough and wheezing 07/13/21   Rodriguez-Southworth, Nettie Elm, PA-C  atorvastatin (LIPITOR) 20 MG tablet Take 1 tablet (20 mg total) by mouth daily. 12/10/11   Karie Schwalbe, MD  Cholecalciferol (VITAMIN D-3) 1000 UNITS CAPS Take 1 capsule by mouth daily.    [provider]  DEPAKOTE ER 500 MG 24 hr tablet TAKE 3 TABLETS BY MOUTH AT BEDTIME 09/23/13   Tillman Abide I, MD  ibuprofen (ADVIL,MOTRIN) 200 MG tablet Take by mouth.    [provider]  Insulin Glargine (LANTUS SOLOSTAR) 100 UNIT/ML Solostar Pen Inject into the skin. 03/14/17 02/19/18  [provider]  insulin lispro (HUMALOG) 100 UNIT/ML KiwkPen Inject 18 Units subcutaneously with breakfast, 18 units sq with lunch, and 20 units sq with dinner.. With SSI. 05/15/17   [provider]  Insulin Pen Needle (BD ULTRA-FINE PEN NEEDLES) 29G X 12.7MM MISC Use 3 times daily to inject insulin 09/23/12   Tillman Abide I, MD  metFORMIN (GLUCOPHAGE) 1000 MG tablet Take 1  tablet (1,000 mg total) by mouth 2 (two) times daily with a meal. 12/08/12   Carlus Pavlov, MD  rizatriptan (MAXALT) 10 MG tablet Take 10 mg by mouth. 1 at onset of migraine headache     [provider]  traZODone (DESYREL) 50 MG tablet Take by mouth. 03/14/17 03/14/18  [provider]  vitamin B-12 (CYANOCOBALAMIN) 1000 MCG tablet Take 1,000 mcg by mouth daily.    [provider]    Family History Family History  Problem Relation Age of Onset   Diabetes Mother    Hypertension Mother    Cancer Father        lung    Coronary artery disease Paternal Grandmother    Breast cancer Neg Hx     Social  History Social History   Tobacco Use   Smoking status: Every Day   Smokeless tobacco: Never   Tobacco comments:    quit but restarted. Plans to stop again with husband and commt lozenges   Substance Use Topics   Alcohol use: No    Comment: rare- wine    Drug use: No     Allergies   Penicillins and Prednisone   Review of Systems Review of Systems  Constitutional:  Positive for fever. Negative for appetite change, chills, diaphoresis and fatigue.  HENT:  Positive for congestion, postnasal drip, sinus pressure and sneezing. Negative for ear discharge, ear pain, sore throat and trouble swallowing.   Eyes:  Negative for discharge.  Respiratory:  Positive for cough and wheezing. Negative for chest tightness and shortness of breath.   Cardiovascular:  Negative for chest pain.  Musculoskeletal:  Negative for myalgias.  Skin:  Negative for rash.  Neurological:  Positive for headaches.  Hematological:  Negative for adenopathy.    Physical Exam Triage Vital Signs ED Triage Vitals  Enc Vitals Group     BP 07/13/21 1136 (!) 176/71     Pulse Rate 07/13/21 1136 84     Resp 07/13/21 1136 16     Temp 07/13/21 1136 98.5 F (36.9 C)     Temp Source 07/13/21 1136 Oral     SpO2 07/13/21 1136 100 %     Weight --      Height --      Head Circumference --      Peak Flow --      Pain Score 07/13/21 1134 0     Pain Loc --      Pain Edu? --      Excl. in GC? --    No data found.  Updated Vital Signs BP (!) 176/71 (BP Location: Left Arm)   Pulse 84   Temp 98.5 F (36.9 C) (Oral)   Resp 16   SpO2 100%   Visual Acuity Right Eye Distance:   Left Eye Distance:   Bilateral Distance:    Right Eye Near:   Left Eye Near:    Bilateral Near:     Physical Exam Alert pt NAD who seems nasally congested EYES- non icterus, mild watering, no purulent drainage NOSE- moderate mucosa congestion which is pale pink mucosa and with clear mucous. Has sinus tenderness all over TM- both gray  and little dull, canals are normal PHARYNX- clear, clear drainage noted.  NECK- supple with no nodes LUNGS- with mild wheezing with forced exhalatios  HEART - RRR with no murmurs SKIN- non jaundiced, no rashes.    UC Treatments / Results  Labs (all labs ordered are listed, but only abnormal  results are displayed) Labs Reviewed - No data to display  EKG   Radiology No results found.  Procedures Procedures (including critical care time)  Medications Ordered in UC Medications - No data to display  Initial Impression / Assessment and Plan / UC Course  I have reviewed the triage vital signs and the nursing notes. URI/ Bronchitis I placed her on Doxy, allegra, and advised to use her Albuterol inhaler q 6h. See instructions. Needs to d/c smoking.     Final Clinical Impressions(s) / UC Diagnoses   Final diagnoses:  Allergic rhinitis due to other allergic trigger, unspecified seasonality  Acute cough  Laryngitis   Discharge Instructions   None    ED Prescriptions     Medication Sig Dispense Auth. Provider   albuterol (PROVENTIL HFA) 108 (90 Base) MCG/ACT inhaler  (Status: Discontinued) Inhale 2 puffs into the lungs every 4 (four) hours as needed for wheezing or shortness of breath. Prn cough and wheezing 1 each Rodriguez-Southworth, Donesha Wallander, PA-C   benzonatate (TESSALON) 200 MG capsule Take 1 capsule (200 mg total) by mouth 2 (two) times daily as needed for cough. 30 capsule Rodriguez-Southworth, Nettie Elm, PA-C   fexofenadine (ALLEGRA) 180 MG tablet Take 1 tablet (180 mg total) by mouth daily. 30 tablet Rodriguez-Southworth, Nettie Elm, PA-C   albuterol (PROVENTIL HFA) 108 (90 Base) MCG/ACT inhaler Inhale 2 puffs into the lungs every 4 (four) hours as needed for wheezing or shortness of breath. Prn cough and wheezing 1 each Rodriguez-Southworth, Nettie Elm, PA-C      PDMP not reviewed this encounter.   Garey Ham, PA-C 07/13/21 1502    Rodriguez-Southworth,  [place], PA-C 07/13/21 1503

## 2021-08-15 IMAGING — MG DIGITAL SCREENING BILAT W/ TOMO W/ CAD
8 series · 8 of 24 positions shown · non-contrast
Comparison: Previous exam(s).

ACR Breast Density Category a: The breast tissue is almost entirely
fatty.

CLINICAL DATA: Screening.

EXAM:
DIGITAL SCREENING BILATERAL MAMMOGRAM WITH TOMO AND CAD

[R MLO synth-2D]
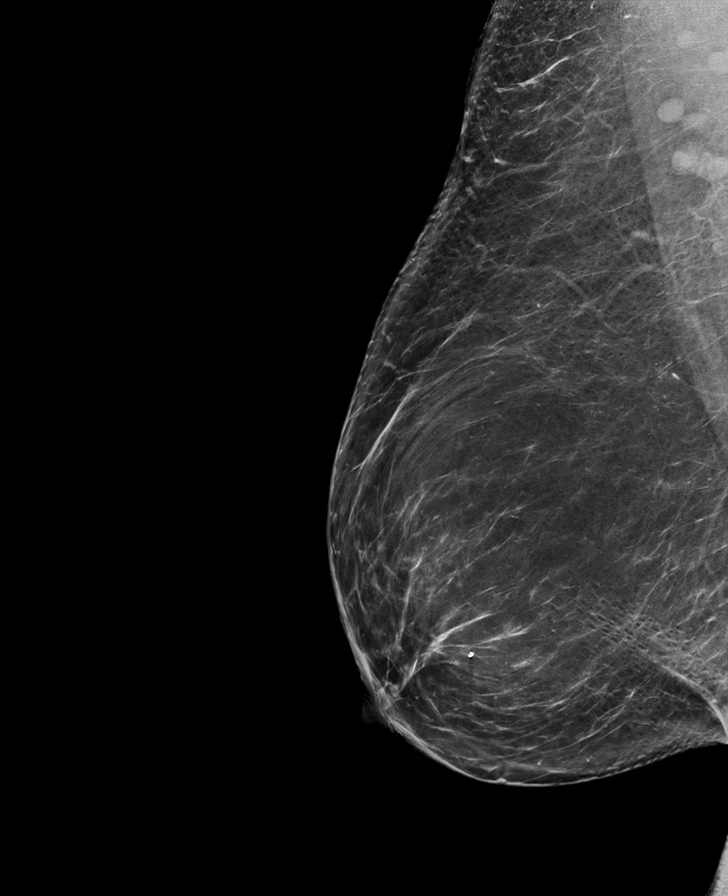

[R CC synth-2D]
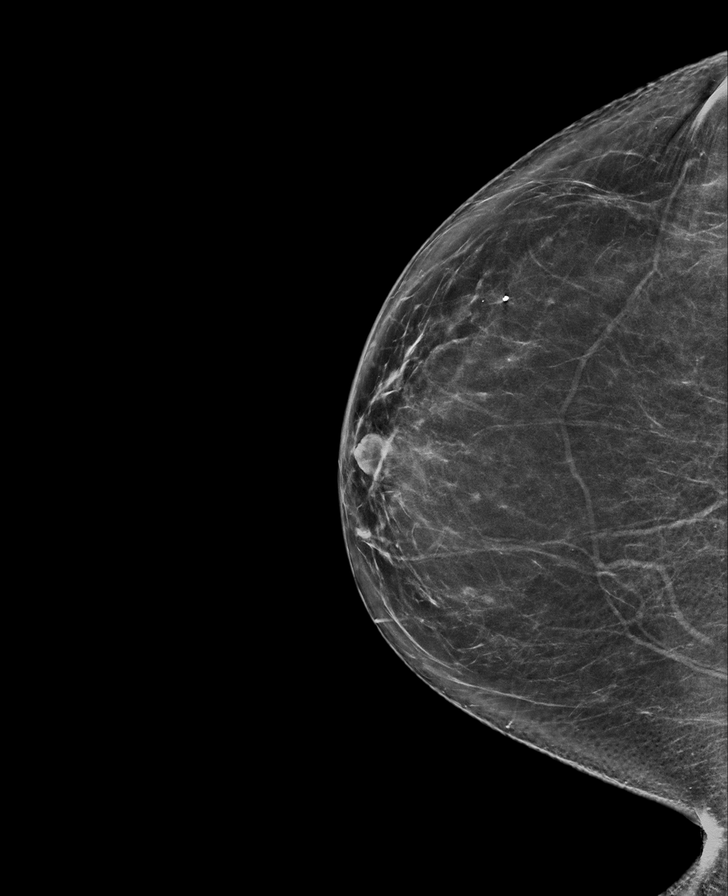

[L CC synth-2D]
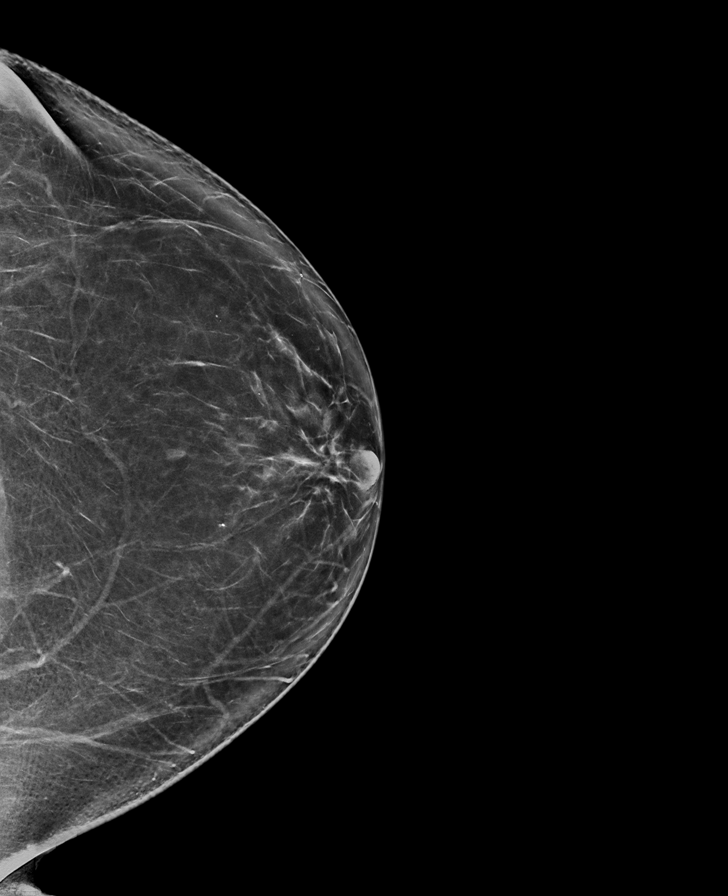

[L MLO synth-2D]
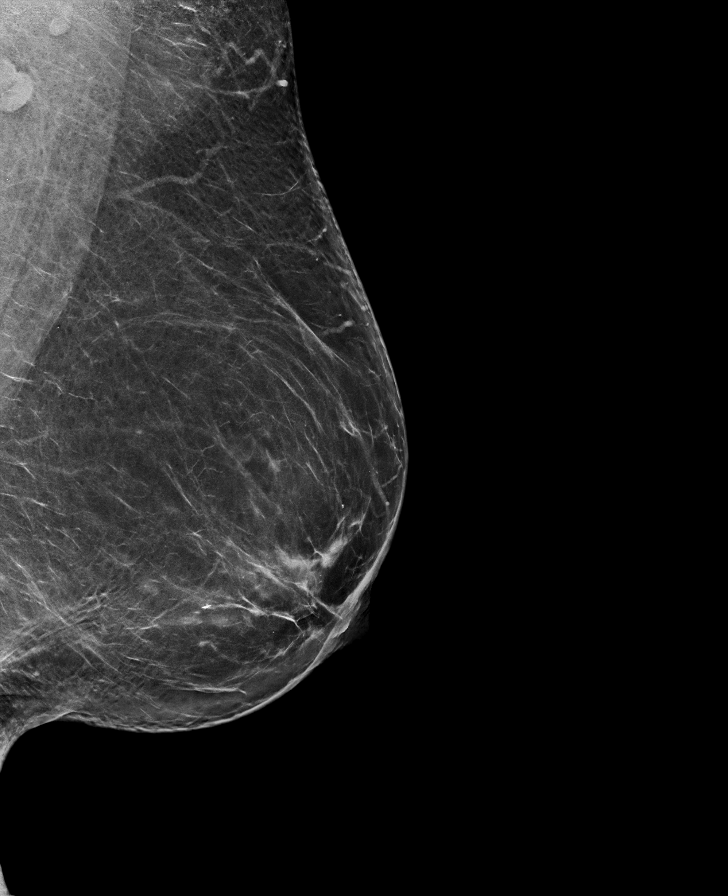

[R CC tomo · tomo slice 38/75.0]
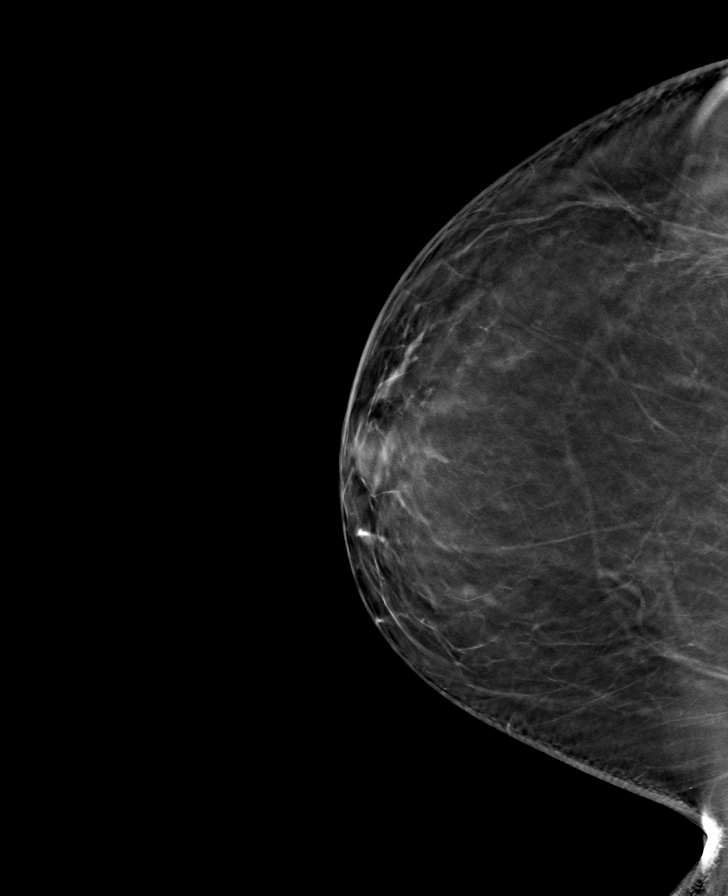

[L MLO tomo · tomo slice 45/90.0]
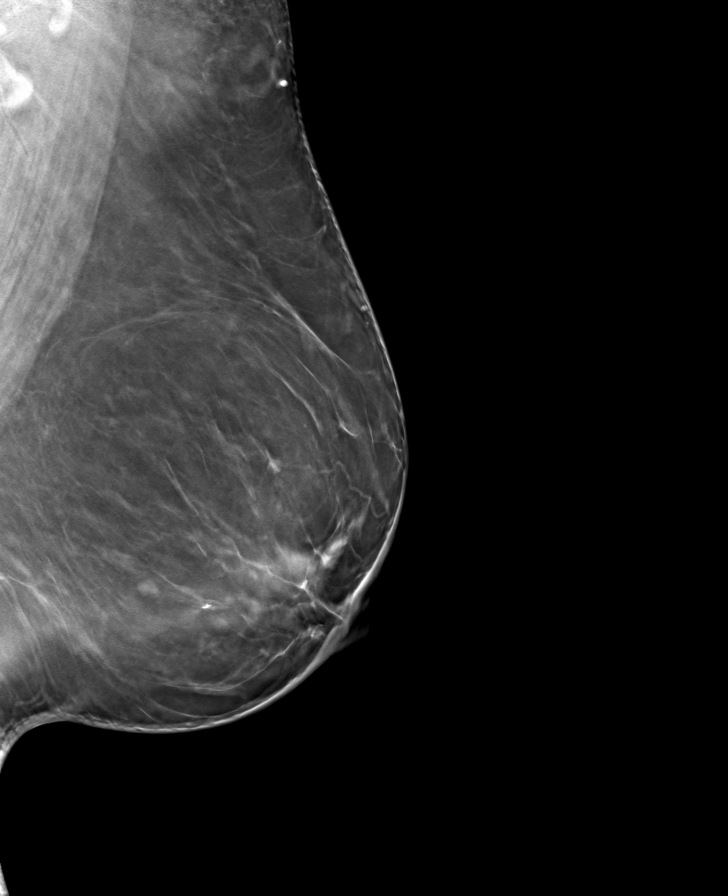

[R MLO tomo · tomo slice 47/93.0]
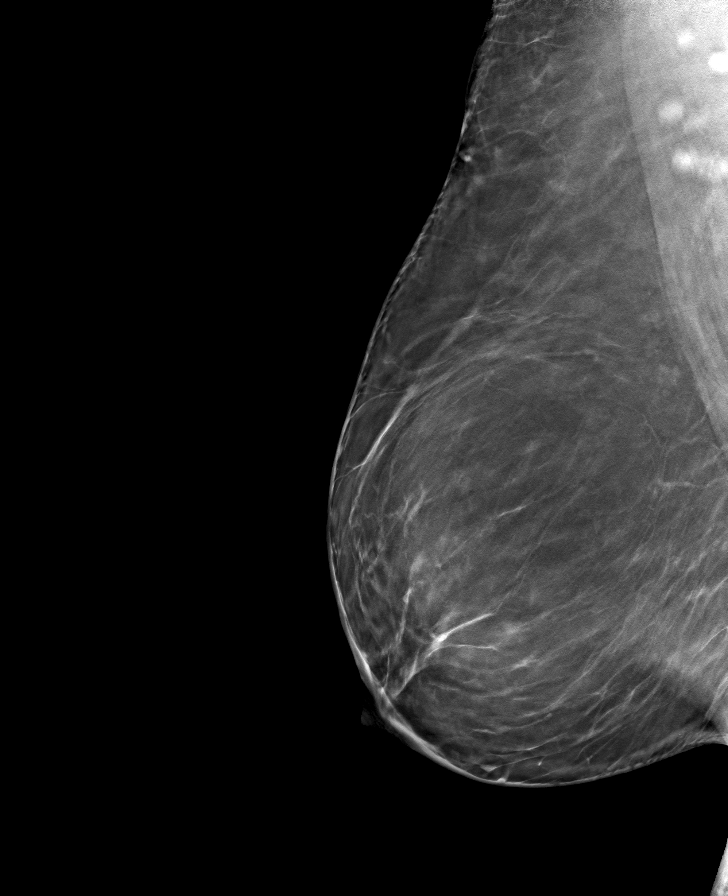

[L CC tomo · tomo slice 43/85.0]
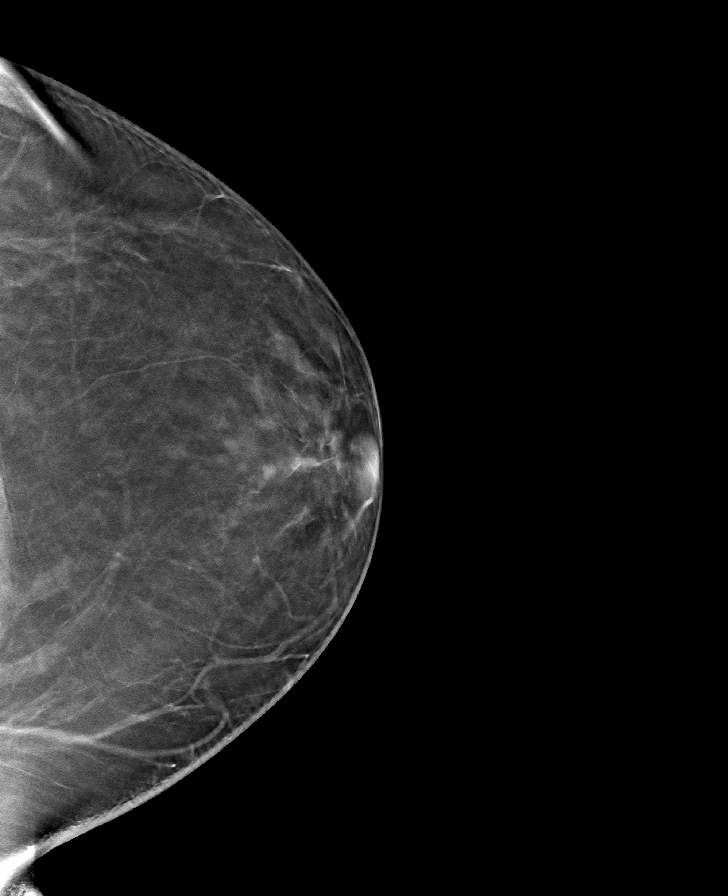

[8 of 24 positions shown; findings below may reference images not displayed]

FINDINGS: There are no findings suspicious for malignancy. Images were
processed with CAD.
IMPRESSION: No mammographic evidence of malignancy. A result letter of this
screening mammogram will be mailed directly to the patient.

RECOMMENDATION:
Screening mammogram in one year. (Code:8Y-[id number])

BI-RADS CATEGORY  1: Negative.

## 2022-05-28 ENCOUNTER — Other Ambulatory Visit: Payer: Self-pay | Admitting: Family Medicine

## 2022-05-28 DIAGNOSIS — Z1231 Encounter for screening mammogram for malignant neoplasm of breast: Secondary | ICD-10-CM

## 2022-07-02 ENCOUNTER — Ambulatory Visit
Admission: RE | Admit: 2022-07-02 | Discharge: 2022-07-02 | Disposition: A | Discharge status: Home / Self Care | Payer: BC Managed Care – PPO | Source: Ambulatory Visit | Attending: Family Medicine | Admitting: Family Medicine

## 2022-07-02 DIAGNOSIS — Z1231 Encounter for screening mammogram for malignant neoplasm of breast: Secondary | ICD-10-CM | POA: Diagnosis not present

## 2023-05-27 LAB — EXTERNAL GENERIC LAB PROCEDURE: COLOGUARD: POSITIVE — AB

## 2023-06-27 ENCOUNTER — Other Ambulatory Visit: Payer: Self-pay | Admitting: Family Medicine

## 2023-06-27 DIAGNOSIS — Z1231 Encounter for screening mammogram for malignant neoplasm of breast: Secondary | ICD-10-CM

## 2023-07-08 ENCOUNTER — Ambulatory Visit
Admission: RE | Admit: 2023-07-08 | Discharge: 2023-07-08 | Disposition: A | Discharge status: Home / Self Care | Payer: Commercial Managed Care - PPO | Source: Ambulatory Visit | Attending: Family Medicine | Admitting: Family Medicine

## 2023-07-08 DIAGNOSIS — Z1231 Encounter for screening mammogram for malignant neoplasm of breast: Secondary | ICD-10-CM | POA: Diagnosis present

## 2024-06-11 ENCOUNTER — Other Ambulatory Visit: Payer: Self-pay | Admitting: Family Medicine

## 2024-06-11 DIAGNOSIS — Z1231 Encounter for screening mammogram for malignant neoplasm of breast: Secondary | ICD-10-CM

## 2024-07-08 ENCOUNTER — Ambulatory Visit
Admission: RE | Admit: 2024-07-08 | Discharge: 2024-07-08 | Disposition: A | Discharge status: Home / Self Care | Source: Ambulatory Visit | Attending: Family Medicine | Admitting: Family Medicine

## 2024-07-08 DIAGNOSIS — Z1231 Encounter for screening mammogram for malignant neoplasm of breast: Secondary | ICD-10-CM | POA: Diagnosis present
